# Patient Record
Sex: Male | Born: 1945 | Race: White | Hispanic: No | Marital: Married | Smoking: Current some day smoker
Health system: Southern US, Community
[De-identification: ages and names within clinical notes are randomized; demographics above are authoritative.]

## PROBLEM LIST (undated history)

## (undated) DIAGNOSIS — I2510 Atherosclerotic heart disease of native coronary artery without angina pectoris: Secondary | ICD-10-CM

## (undated) DIAGNOSIS — Z77098 Contact with and (suspected) exposure to other hazardous, chiefly nonmedicinal, chemicals: Secondary | ICD-10-CM

## (undated) DIAGNOSIS — I729 Aneurysm of unspecified site: Secondary | ICD-10-CM

## (undated) DIAGNOSIS — K439 Ventral hernia without obstruction or gangrene: Secondary | ICD-10-CM

## (undated) DIAGNOSIS — Z95810 Presence of automatic (implantable) cardiac defibrillator: Secondary | ICD-10-CM

## (undated) DIAGNOSIS — I10 Essential (primary) hypertension: Secondary | ICD-10-CM

## (undated) DIAGNOSIS — E079 Disorder of thyroid, unspecified: Secondary | ICD-10-CM

## (undated) DIAGNOSIS — E119 Type 2 diabetes mellitus without complications: Secondary | ICD-10-CM

## (undated) DIAGNOSIS — I4891 Unspecified atrial fibrillation: Secondary | ICD-10-CM

## (undated) HISTORY — DX: Essential (primary) hypertension: I10

## (undated) HISTORY — PX: OTHER SURGICAL HISTORY: SHX169

---

## 2018-05-17 ENCOUNTER — Ambulatory Visit (INDEPENDENT_AMBULATORY_CARE_PROVIDER_SITE_OTHER): Payer: Self-pay | Admitting: Otolaryngology

## 2018-06-13 ENCOUNTER — Encounter (HOSPITAL_COMMUNITY): Payer: Self-pay

## 2018-06-13 ENCOUNTER — Inpatient Hospital Stay (HOSPITAL_COMMUNITY)
Admission: EM | Admit: 2018-06-13 | Discharge: 2018-06-16 | DRG: 871 | Disposition: A | Discharge status: Home / Self Care | Payer: Non-veteran care | Attending: Internal Medicine | Admitting: Internal Medicine

## 2018-06-13 ENCOUNTER — Emergency Department (HOSPITAL_COMMUNITY): Payer: Non-veteran care

## 2018-06-13 ENCOUNTER — Other Ambulatory Visit: Payer: Self-pay

## 2018-06-13 DIAGNOSIS — I2510 Atherosclerotic heart disease of native coronary artery without angina pectoris: Secondary | ICD-10-CM | POA: Diagnosis present

## 2018-06-13 DIAGNOSIS — E876 Hypokalemia: Secondary | ICD-10-CM | POA: Diagnosis present

## 2018-06-13 DIAGNOSIS — J101 Influenza due to other identified influenza virus with other respiratory manifestations: Secondary | ICD-10-CM | POA: Diagnosis present

## 2018-06-13 DIAGNOSIS — R531 Weakness: Secondary | ICD-10-CM

## 2018-06-13 DIAGNOSIS — A419 Sepsis, unspecified organism: Secondary | ICD-10-CM | POA: Diagnosis present

## 2018-06-13 DIAGNOSIS — Z8679 Personal history of other diseases of the circulatory system: Secondary | ICD-10-CM

## 2018-06-13 DIAGNOSIS — Z6820 Body mass index (BMI) 20.0-20.9, adult: Secondary | ICD-10-CM

## 2018-06-13 DIAGNOSIS — E039 Hypothyroidism, unspecified: Secondary | ICD-10-CM | POA: Diagnosis present

## 2018-06-13 DIAGNOSIS — R627 Adult failure to thrive: Secondary | ICD-10-CM | POA: Diagnosis present

## 2018-06-13 DIAGNOSIS — J9601 Acute respiratory failure with hypoxia: Secondary | ICD-10-CM | POA: Diagnosis present

## 2018-06-13 DIAGNOSIS — I5022 Chronic systolic (congestive) heart failure: Secondary | ICD-10-CM | POA: Diagnosis not present

## 2018-06-13 DIAGNOSIS — E861 Hypovolemia: Secondary | ICD-10-CM | POA: Diagnosis present

## 2018-06-13 DIAGNOSIS — Z77098 Contact with and (suspected) exposure to other hazardous, chiefly nonmedicinal, chemicals: Secondary | ICD-10-CM

## 2018-06-13 DIAGNOSIS — I482 Chronic atrial fibrillation, unspecified: Secondary | ICD-10-CM | POA: Diagnosis present

## 2018-06-13 DIAGNOSIS — Z79890 Hormone replacement therapy (postmenopausal): Secondary | ICD-10-CM

## 2018-06-13 DIAGNOSIS — Z95810 Presence of automatic (implantable) cardiac defibrillator: Secondary | ICD-10-CM | POA: Diagnosis present

## 2018-06-13 DIAGNOSIS — Z7901 Long term (current) use of anticoagulants: Secondary | ICD-10-CM | POA: Diagnosis not present

## 2018-06-13 DIAGNOSIS — Z87891 Personal history of nicotine dependence: Secondary | ICD-10-CM

## 2018-06-13 DIAGNOSIS — I252 Old myocardial infarction: Secondary | ICD-10-CM | POA: Diagnosis not present

## 2018-06-13 DIAGNOSIS — Z79899 Other long term (current) drug therapy: Secondary | ICD-10-CM

## 2018-06-13 DIAGNOSIS — Z955 Presence of coronary angioplasty implant and graft: Secondary | ICD-10-CM

## 2018-06-13 DIAGNOSIS — E46 Unspecified protein-calorie malnutrition: Secondary | ICD-10-CM | POA: Diagnosis present

## 2018-06-13 DIAGNOSIS — I5033 Acute on chronic diastolic (congestive) heart failure: Secondary | ICD-10-CM | POA: Diagnosis not present

## 2018-06-13 DIAGNOSIS — Z7902 Long term (current) use of antithrombotics/antiplatelets: Secondary | ICD-10-CM | POA: Diagnosis not present

## 2018-06-13 DIAGNOSIS — I5032 Chronic diastolic (congestive) heart failure: Secondary | ICD-10-CM | POA: Diagnosis present

## 2018-06-13 DIAGNOSIS — E440 Moderate protein-calorie malnutrition: Secondary | ICD-10-CM

## 2018-06-13 DIAGNOSIS — R0602 Shortness of breath: Secondary | ICD-10-CM | POA: Diagnosis not present

## 2018-06-13 HISTORY — DX: Aneurysm of unspecified site: I72.9

## 2018-06-13 HISTORY — DX: Disorder of thyroid, unspecified: E07.9

## 2018-06-13 HISTORY — DX: Type 2 diabetes mellitus without complications: E11.9

## 2018-06-13 HISTORY — DX: Ventral hernia without obstruction or gangrene: K43.9

## 2018-06-13 HISTORY — DX: Contact with and (suspected) exposure to other hazardous, chiefly nonmedicinal, chemicals: Z77.098

## 2018-06-13 LAB — CBC WITH DIFFERENTIAL/PLATELET
Abs Immature Granulocytes: 0.04 10*3/uL (ref 0.00–0.07)
Basophils Absolute: 0 10*3/uL (ref 0.0–0.1)
Basophils Relative: 0 %
Eosinophils Absolute: 0.2 10*3/uL (ref 0.0–0.5)
Eosinophils Relative: 2 %
HCT: 34.5 % — ABNORMAL LOW (ref 39.0–52.0)
Hemoglobin: 12.1 g/dL — ABNORMAL LOW (ref 13.0–17.0)
Immature Granulocytes: 1 %
Lymphocytes Relative: 3 %
Lymphs Abs: 0.2 10*3/uL — ABNORMAL LOW (ref 0.7–4.0)
MCH: 35.1 pg — ABNORMAL HIGH (ref 26.0–34.0)
MCHC: 35.1 g/dL (ref 30.0–36.0)
MCV: 100 fL (ref 80.0–100.0)
Monocytes Absolute: 0.6 10*3/uL (ref 0.1–1.0)
Monocytes Relative: 7 %
Neutro Abs: 7.3 10*3/uL (ref 1.7–7.7)
Neutrophils Relative %: 87 %
Platelets: 189 10*3/uL (ref 150–400)
RBC: 3.45 MIL/uL — ABNORMAL LOW (ref 4.22–5.81)
RDW: 13.1 % (ref 11.5–15.5)
WBC: 8.4 10*3/uL (ref 4.0–10.5)
nRBC: 0 % (ref 0.0–0.2)

## 2018-06-13 LAB — APTT: APTT: 43 s — AB (ref 24–36)

## 2018-06-13 LAB — URINALYSIS, ROUTINE W REFLEX MICROSCOPIC
Bacteria, UA: NONE SEEN
Bilirubin Urine: NEGATIVE
Glucose, UA: NEGATIVE mg/dL
Ketones, ur: 20 mg/dL — AB
Leukocytes,Ua: NEGATIVE
Nitrite: NEGATIVE
Protein, ur: 30 mg/dL — AB
Specific Gravity, Urine: 1.026 (ref 1.005–1.030)
pH: 5 (ref 5.0–8.0)

## 2018-06-13 LAB — PROTIME-INR
INR: 1.4 — ABNORMAL HIGH (ref 0.8–1.2)
Prothrombin Time: 17.2 seconds — ABNORMAL HIGH (ref 11.4–15.2)

## 2018-06-13 LAB — BASIC METABOLIC PANEL
Anion gap: 9 (ref 5–15)
BUN: 15 mg/dL (ref 8–23)
CO2: 23 mmol/L (ref 22–32)
Calcium: 8.7 mg/dL — ABNORMAL LOW (ref 8.9–10.3)
Chloride: 107 mmol/L (ref 98–111)
Creatinine, Ser: 1.21 mg/dL (ref 0.61–1.24)
GFR calc Af Amer: >60 mL/min (ref >60)
GFR calc non Af Amer: 58 mL/min — ABNORMAL LOW (ref >60)
Glucose, Bld: 116 mg/dL — ABNORMAL HIGH (ref 70–99)
Potassium: 3.2 mmol/L — ABNORMAL LOW (ref 3.5–5.1)
Sodium: 139 mmol/L (ref 135–145)

## 2018-06-13 LAB — PROCALCITONIN: Procalcitonin: 0.16 ng/mL

## 2018-06-13 LAB — MRSA PCR SCREENING: MRSA by PCR: NEGATIVE

## 2018-06-13 LAB — LACTIC ACID, PLASMA
Lactic Acid, Venous: 1.8 mmol/L (ref 0.5–1.9)
Lactic Acid, Venous: 1.6 mmol/L (ref 0.5–1.9)
Lactic Acid, Venous: 4.8 mmol/L (ref 0.5–1.9)

## 2018-06-13 LAB — INFLUENZA PANEL BY PCR (TYPE A & B)
Influenza A By PCR: POSITIVE — AB
Influenza B By PCR: NEGATIVE

## 2018-06-13 MED ORDER — IBUPROFEN 400 MG PO TABS
600.0000 mg | ORAL_TABLET | Freq: Once | ORAL | Status: AC
  Administered 2018-06-13: 600 mg via ORAL
  Filled 2018-06-13: qty 2

## 2018-06-13 MED ORDER — ALBUTEROL SULFATE HFA 108 (90 BASE) MCG/ACT IN AERS
2.0000 | INHALATION_SPRAY | Freq: Once | RESPIRATORY_TRACT | Status: AC
  Administered 2018-06-13: 2 via RESPIRATORY_TRACT
  Filled 2018-06-13: qty 6.7

## 2018-06-13 MED ORDER — SODIUM CHLORIDE 0.9 % IV BOLUS
500.0000 mL | Freq: Once | INTRAVENOUS | Status: AC
  Administered 2018-06-13: 500 mL via INTRAVENOUS

## 2018-06-13 MED ORDER — ALBUTEROL SULFATE (2.5 MG/3ML) 0.083% IN NEBU: 2.5000 mg | INHALATION_SOLUTION | RESPIRATORY_TRACT | Status: DC | PRN

## 2018-06-13 MED ORDER — ONDANSETRON HCL 4 MG/2ML IJ SOLN
4.0000 mg | Freq: Four times a day (QID) | INTRAMUSCULAR | Status: DC | PRN
  Administered 2018-06-13 – 2018-06-14 (×2): 4 mg via INTRAVENOUS
  Filled 2018-06-13 (×2): qty 2

## 2018-06-13 MED ORDER — SODIUM CHLORIDE 0.9 % IV SOLN
INTRAVENOUS | Status: AC
  Administered 2018-06-13 – 2018-06-14 (×3): via INTRAVENOUS

## 2018-06-13 MED ORDER — IPRATROPIUM-ALBUTEROL 0.5-2.5 (3) MG/3ML IN SOLN
3.0000 mL | Freq: Once | RESPIRATORY_TRACT | Status: AC
  Administered 2018-06-13: 3 mL via RESPIRATORY_TRACT
  Filled 2018-06-13: qty 3

## 2018-06-13 MED ORDER — OXYCODONE-ACETAMINOPHEN 5-325 MG PO TABS
1.0000 | ORAL_TABLET | Freq: Once | ORAL | Status: AC
  Administered 2018-06-13: 1 via ORAL
  Filled 2018-06-13: qty 1

## 2018-06-13 MED ORDER — ATORVASTATIN CALCIUM 20 MG PO TABS
20.0000 mg | ORAL_TABLET | Freq: Every day | ORAL | Status: DC
  Administered 2018-06-13 – 2018-06-15 (×3): 20 mg via ORAL
  Filled 2018-06-13 (×3): qty 1

## 2018-06-13 MED ORDER — IBUPROFEN 400 MG PO TABS
600.0000 mg | ORAL_TABLET | Freq: Four times a day (QID) | ORAL | Status: DC | PRN
  Administered 2018-06-13: 600 mg via ORAL
  Filled 2018-06-13: qty 2

## 2018-06-13 MED ORDER — SODIUM CHLORIDE 0.9 % IV BOLUS
1000.0000 mL | Freq: Once | INTRAVENOUS | Status: AC
  Administered 2018-06-13: 1000 mL via INTRAVENOUS

## 2018-06-13 MED ORDER — LEVOTHYROXINE SODIUM 112 MCG PO TABS
112.0000 ug | ORAL_TABLET | Freq: Every morning | ORAL | Status: DC
  Administered 2018-06-14 – 2018-06-16 (×3): 112 ug via ORAL
  Filled 2018-06-13 (×3): qty 1

## 2018-06-13 MED ORDER — ACETAMINOPHEN 650 MG RE SUPP: 650.0000 mg | Freq: Four times a day (QID) | RECTAL | Status: DC | PRN

## 2018-06-13 MED ORDER — TRAZODONE HCL 50 MG PO TABS
200.0000 mg | ORAL_TABLET | Freq: Every day | ORAL | Status: DC
  Administered 2018-06-13 – 2018-06-15 (×3): 200 mg via ORAL
  Filled 2018-06-13 (×3): qty 4

## 2018-06-13 MED ORDER — GUAIFENESIN-DM 100-10 MG/5ML PO SYRP: 5.0000 mL | ORAL_SOLUTION | ORAL | Status: DC | PRN

## 2018-06-13 MED ORDER — ADULT MULTIVITAMIN W/MINERALS CH
1.0000 | ORAL_TABLET | Freq: Every day | ORAL | Status: DC
  Administered 2018-06-13 – 2018-06-16 (×4): 1 via ORAL
  Filled 2018-06-13 (×4): qty 1

## 2018-06-13 MED ORDER — ACETAMINOPHEN 325 MG PO TABS
650.0000 mg | ORAL_TABLET | Freq: Once | ORAL | Status: AC
  Administered 2018-06-13: 650 mg via ORAL

## 2018-06-13 MED ORDER — POTASSIUM CHLORIDE CRYS ER 20 MEQ PO TBCR
40.0000 meq | EXTENDED_RELEASE_TABLET | Freq: Once | ORAL | Status: AC
  Administered 2018-06-13: 40 meq via ORAL
  Filled 2018-06-13: qty 2

## 2018-06-13 MED ORDER — ONDANSETRON HCL 4 MG PO TABS: 4.0000 mg | ORAL_TABLET | Freq: Four times a day (QID) | ORAL | Status: DC | PRN

## 2018-06-13 MED ORDER — OSELTAMIVIR PHOSPHATE 75 MG PO CAPS: 75.0000 mg | ORAL_CAPSULE | Freq: Two times a day (BID) | ORAL | 0 refills | Status: DC

## 2018-06-13 MED ORDER — DM-GUAIFENESIN ER 30-600 MG PO TB12
1.0000 | ORAL_TABLET | Freq: Two times a day (BID) | ORAL | Status: DC
  Administered 2018-06-13 – 2018-06-16 (×6): 1 via ORAL
  Filled 2018-06-13 (×6): qty 1

## 2018-06-13 MED ORDER — LORATADINE 10 MG PO TABS
10.0000 mg | ORAL_TABLET | Freq: Every day | ORAL | Status: DC
  Administered 2018-06-14 – 2018-06-16 (×3): 10 mg via ORAL
  Filled 2018-06-13 (×3): qty 1

## 2018-06-13 MED ORDER — ACETAMINOPHEN 325 MG PO TABS
650.0000 mg | ORAL_TABLET | Freq: Four times a day (QID) | ORAL | Status: DC | PRN
  Administered 2018-06-16: 650 mg via ORAL
  Filled 2018-06-13: qty 2

## 2018-06-13 MED ORDER — OSELTAMIVIR PHOSPHATE 75 MG PO CAPS
75.0000 mg | ORAL_CAPSULE | Freq: Once | ORAL | Status: AC
  Administered 2018-06-13: 75 mg via ORAL
  Filled 2018-06-13: qty 1

## 2018-06-13 MED ORDER — AMIODARONE HCL 200 MG PO TABS
100.0000 mg | ORAL_TABLET | Freq: Every day | ORAL | Status: DC
  Administered 2018-06-13 – 2018-06-16 (×4): 100 mg via ORAL
  Filled 2018-06-13 (×4): qty 1

## 2018-06-13 MED ORDER — CLOPIDOGREL BISULFATE 75 MG PO TABS
75.0000 mg | ORAL_TABLET | Freq: Every morning | ORAL | Status: DC
  Administered 2018-06-14 – 2018-06-16 (×3): 75 mg via ORAL
  Filled 2018-06-13 (×3): qty 1

## 2018-06-13 MED ORDER — OSELTAMIVIR PHOSPHATE 75 MG PO CAPS
75.0000 mg | ORAL_CAPSULE | Freq: Two times a day (BID) | ORAL | Status: DC
  Administered 2018-06-14: 75 mg via ORAL
  Filled 2018-06-13: qty 1

## 2018-06-13 MED ORDER — APIXABAN 5 MG PO TABS
5.0000 mg | ORAL_TABLET | Freq: Two times a day (BID) | ORAL | Status: DC
  Administered 2018-06-13 – 2018-06-16 (×6): 5 mg via ORAL
  Filled 2018-06-13 (×6): qty 1

## 2018-06-13 MED ORDER — PROMETHAZINE HCL 12.5 MG PO TABS
25.0000 mg | ORAL_TABLET | Freq: Four times a day (QID) | ORAL | Status: DC | PRN
  Administered 2018-06-14: 25 mg via ORAL
  Filled 2018-06-13: qty 2

## 2018-06-13 NOTE — ED Notes (Signed)
ED TO INPATIENT HANDOFF REPORT  ED Nurse Name and Phone #:  Dorathy Daft [phone]  S Name/Age/Gender Anthony Charles 73 y.o. male Room/Bed: APA18/APA18  Code Status   Code Status: Not on file  Home/SNF/Other Home Patient oriented to: self, place, time and situation Is this baseline? Yes   Triage Complete: Triage complete  Chief Complaint weakness fever  Triage Note Ems reports pt lives with wife at home.  C/O generalized weakness, bodyaches, and cough x 2 days.   Pt reports he went to [place] in [place] last week for regular appt.  Reports his pacemaker/defibrillator has fired frequently.     Allergies No Known Allergies  Level of Care/Admitting Diagnosis ED Disposition    ED Disposition Condition Comment   Admit  Hospital Area: [facility] [[id number]]  Level of Care: Stepdown [14]  Diagnosis: Sepsis [facility]) [[id number]]  Admitting Physician: Eddie North [phone]  Attending Physician: Eddie North [phone]  Estimated length of stay: past midnight tomorrow  Certification:: I certify this patient will need inpatient services for at least 2 midnights  PT Class (Do Not Modify): Inpatient [101]  PT Acc Code (Do Not Modify): Private [1]       B Medical/Surgery History Past Medical History:  Diagnosis Date  . Agent orange exposure   . Aneurysm (HCC)   . Diabetes mellitus without complication (HCC)   . Hernia of abdominal wall   . Thyroid disease    Past Surgical History:  Procedure Laterality Date  . pacemaker/defibrillator       A IV Location/Drains/Wounds Patient Lines/Drains/Airways Status   Active Line/Drains/Airways    Name:   Placement date:   Placement time:   Site:   Days:   Peripheral IV 06/13/18 Right Antecubital   06/13/18    1326    Antecubital   less than 1          Intake/Output Last 24 hours  Intake/Output Summary (Last 24 hours) at 06/13/2018 2053 Last data filed at 06/13/2018 1854 Gross per 24 hour  Intake 1000 ml  Output -  Net 1000 ml     Labs/Imaging Results for orders placed or performed during the hospital encounter of 06/13/18 (from the past 48 hour(s))  Urinalysis, Routine w reflex microscopic     Status: Abnormal   Collection Time: 06/13/18  1:09 PM  Result Value Ref Range   Color, Urine YELLOW YELLOW   APPearance HAZY (A) CLEAR   Specific Gravity, Urine 1.026 1.005 - 1.030   pH 5.0 5.0 - 8.0   Glucose, UA NEGATIVE NEGATIVE mg/dL   Hgb urine dipstick SMALL (A) NEGATIVE   Bilirubin Urine NEGATIVE NEGATIVE   Ketones, ur 20 (A) NEGATIVE mg/dL   Protein, ur 30 (A) NEGATIVE mg/dL   Nitrite NEGATIVE NEGATIVE   Leukocytes,Ua NEGATIVE NEGATIVE   RBC / HPF 6-10 0 - 5 RBC/hpf   WBC, UA 0-5 0 - 5 WBC/hpf   Bacteria, UA NONE SEEN NONE SEEN   Squamous Epithelial / LPF 0-5 0 - 5   Mucus PRESENT     Comment: Performed at [facility], [address] [place], [place] [address]  Influenza panel by PCR (type A & B)     Status: Abnormal   Collection Time: 06/13/18  1:09 PM  Result Value Ref Range   Influenza A By PCR POSITIVE (A) NEGATIVE   Influenza B By PCR NEGATIVE NEGATIVE    Comment: (NOTE) The Xpert Xpress Flu assay is intended as an aid in the diagnosis of  influenza and should not be used as a sole basis for treatment.  This  assay is FDA approved for nasopharyngeal swab specimens only. Nasal  washings and aspirates are unacceptable for Xpert Xpress Flu testing. Performed at [facility], [address], [place], [place] [address]   Blood culture (routine x 2)     Status: None (Preliminary result)   Collection Time: 06/13/18  1:20 PM  Result Value Ref Range   Specimen Description RIGHT ANTECUBITAL    Special Requests      BOTTLES DRAWN AEROBIC AND ANAEROBIC Blood Culture adequate volume Performed at [facility], [address] [place], [place] [address]    Culture PENDING    Report Status PENDING   CBC with Differential     Status: Abnormal   Collection Time: 06/13/18  1:35 PM  Result Value Ref Range    WBC 8.4 4.0 - 10.5 K/uL   RBC 3.45 (L) 4.22 - 5.81 MIL/uL   Hemoglobin 12.1 (L) 13.0 - 17.0 g/dL   HCT 08.8 (L) 11.0 - 31.5 %   MCV 100.0 80.0 - 100.0 fL   MCH 35.1 (H) 26.0 - 34.0 pg   MCHC 35.1 30.0 - 36.0 g/dL   RDW 94.5 85.9 - 29.2 %   Platelets 189 150 - 400 K/uL   nRBC 0.0 0.0 - 0.2 %   Neutrophils Relative % 87 %   Neutro Abs 7.3 1.7 - 7.7 K/uL   Lymphocytes Relative 3 %   Lymphs Abs 0.2 (L) 0.7 - 4.0 K/uL   Monocytes Relative 7 %   Monocytes Absolute 0.6 0.1 - 1.0 K/uL   Eosinophils Relative 2 %   Eosinophils Absolute 0.2 0.0 - 0.5 K/uL   Basophils Relative 0 %   Basophils Absolute 0.0 0.0 - 0.1 K/uL   Immature Granulocytes 1 %   Abs Immature Granulocytes 0.04 0.00 - 0.07 K/uL    Comment: Performed at [facility], [address], [place], [place] [address]  Basic metabolic panel     Status: Abnormal   Collection Time: 06/13/18  1:35 PM  Result Value Ref Range   Sodium 139 135 - 145 mmol/L   Potassium 3.2 (L) 3.5 - 5.1 mmol/L   Chloride 107 98 - 111 mmol/L   CO2 23 22 - 32 mmol/L   Glucose, Bld 116 (H) 70 - 99 mg/dL   BUN 15 8 - 23 mg/dL   Creatinine, Ser 6.38 0.61 - 1.24 mg/dL   Calcium 8.7 (L) 8.9 - 10.3 mg/dL   GFR calc non Af Amer 58 (L) >60 mL/min   GFR calc Af Amer >60 >60 mL/min   Anion gap 9 5 - 15    Comment: Performed at [facility], [address] [place], [place] [address]  Lactic acid, plasma     Status: None   Collection Time: 06/13/18  1:35 PM  Result Value Ref Range   Lactic Acid, Venous 1.8 0.5 - 1.9 mmol/L    Comment: Performed at [facility], [address], [place], [place] [address]  Blood culture (routine x 2)     Status: None (Preliminary result)   Collection Time: 06/13/18  1:45 PM  Result Value Ref Range   Specimen Description LEFT ANTECUBITAL    Special Requests      BOTTLES DRAWN AEROBIC AND ANAEROBIC Blood Culture adequate volume Performed at [facility], [address], [place], [place] [address]    Culture PENDING    Report  Status PENDING   Lactic  acid, plasma     Status: None   Collection Time: 06/13/18  3:27 PM  Result Value Ref Range   Lactic Acid, Venous 1.6 0.5 - 1.9 mmol/L    Comment: Performed at [facility], [address], [place], [place] [address]   Dg Chest 2 View  Result Date: 06/13/2018 CLINICAL DATA:  Weakness, body aches, cough EXAM: CHEST - 2 VIEW COMPARISON:  None. FINDINGS: Pulmonary hyperinflation and probable emphysematous change. No acute abnormality of the lungs. Left chest multi lead pacer defibrillator. Cardiomegaly. Disc degenerative disease of the thoracic spine. IMPRESSION: Pulmonary hyperinflation and probable emphysematous change. No acute appearing airspace opacity. Cardiomegaly. Electronically Signed   By: Lauralyn Primes M.D.   On: 06/13/2018 14:45    Pending Labs [employer] (From admission, onward)    Start     Ordered   Signed and Held  Lactic acid, plasma  STAT Now then every 3 hours,   STAT     Signed and Held   Signed and Held  Procalcitonin  ONCE - STAT,   R     Signed and Held   Signed and Held  Protime-INR  ONCE - STAT,   R     Signed and Held   Signed and Held  APTT  ONCE - STAT,   R     Signed and Held   Signed and Armed forces training and education officer morning,   R     Signed and Held   Signed and Held  CBC  Tomorrow morning,   R     Signed and Held          Vitals/Pain Today's Vitals   06/13/18 2000 06/13/18 2015 06/13/18 2030 06/13/18 2045  BP: (!) 83/69  100/64   Pulse: (!) 128 (!) 139  80  Resp: (!) 22 (!) 25  14  Temp:      TempSrc:      SpO2: 94% 95%    Weight:      Height:      PainSc:        Isolation Precautions Droplet precaution  Medications Medications  0.9 %  sodium chloride infusion ( Intravenous New Bag/Given 06/13/18 2003)  ibuprofen (ADVIL,MOTRIN) tablet 600 mg (600 mg Oral Given 06/13/18 1851)  acetaminophen (TYLENOL) tablet 650 mg (650 mg Oral Given 06/13/18 1424)  oseltamivir (TAMIFLU) capsule 75 mg (75 mg Oral Given  06/13/18 1459)  potassium chloride SA (K-DUR,KLOR-CON) CR tablet 40 mEq (40 mEq Oral Given 06/13/18 1459)  sodium chloride 0.9 % bolus 1,000 mL (0 mLs Intravenous Stopped 06/13/18 1854)  ipratropium-albuterol (DUONEB) 0.5-2.5 (3) MG/3ML nebulizer solution 3 mL (3 mLs Nebulization Given 06/13/18 1535)  ibuprofen (ADVIL,MOTRIN) tablet 600 mg (600 mg Oral Given 06/13/18 1526)  oxyCODONE-acetaminophen (PERCOCET/ROXICET) 5-325 MG per tablet 1 tablet (1 tablet Oral Given 06/13/18 1526)  albuterol (PROVENTIL HFA;VENTOLIN HFA) 108 (90 Base) MCG/ACT inhaler 2 puff (2 puffs Inhalation Given 06/13/18 1651)    Mobility walks Moderate fall risk   Focused Assessments Pulmonary Assessment Handoff:  Lung sounds: Bilateral Breath Sounds: Diminished O2 Device: Room Air        R Recommendations: See Admitting Provider Note  Report given to:   Additional Notes:

## 2018-06-13 NOTE — ED Triage Notes (Signed)
Ems reports pt lives with wife at home.  C/O generalized weakness, bodyaches, and cough x 2 days.

## 2018-06-13 NOTE — ED Notes (Signed)
hospitalist in with pt at this time 

## 2018-06-13 NOTE — ED Triage Notes (Signed)
Pt reports he went to [place] in [place] last week for regular appt.  Reports his pacemaker/defibrillator has fired frequently.

## 2018-06-13 NOTE — Progress Notes (Signed)
CRITICAL VALUE ALERT  Critical Value:  Lactic acid 4.8  Date & Time Notied:  06-13-2018 2245  Provider Notified: Schorr  Orders Received/Actions taken: pending new orders

## 2018-06-13 NOTE — ED Notes (Signed)
Went in to d/c pt, rr 24, o2 sat 88-90% on room air and bp 85/53.  Notified Dr. Jeraldine Loots and he will assess pt.

## 2018-06-13 NOTE — H&P (Addendum)
TRH H&P   Patient Demographics:    Anthony Charles, is a 73 y.o. male  MRN: [id number]   DOB - 07/07/1945  Admit Date - 06/13/2018  [facility] [place] ([place] living in [place] for past [place] [place] [place]  Referring MD: Dr. Jeraldine LootsLockwood  Outpatient Specialists: Cardi[place] in [place]  Patient coming from: Home  Chief Complaint  Patient presents with   Weakness   Fever      HPI:    Anthony Conferorman Defibaugh  is a 73 y.o. male, [place] veteran with history of CAD with history of MI s/p stenting in 2016 in [place], history of defibrillator and?  Pacemaker status, A. 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  He checked his temperature this morning and was 100 F.  Reports nausea but no vomiting.  Has poor appetite.  Patient reports progressively getting worse with increased weakness and poor appetite.  He denies any sick contact or recent travel except for going [place] [facility] [place] [place] months back for his visit to the cardiologist and his defibrillator checked at that time.  He denies any new medications, sick contact.  Denies any headache, dizziness, blurred vision, chest pain, orthopnea or PND.  Denies any abdominal pain, dysuria or diarrhea.  Denies any tingling or numbness of his extremities, fall or trauma.  Denies any recent illness. He informs that he didn't take flu vaccine this season.  The ED his vitals initially were stable but he became hypotensive with systolic blood pressure in the 70s, tachycardic to 140s, tachypneic and O2 sat dropped to mid 80s requiring 2 L via nasal  cannula.  He was then found to be febrile at 102.4 F.  Blood work showed WC of 8.4, hemoglobin of 12.1, normal platelets.  Sodium of 139, K of 3.2, normal anion gap, BUN of 15 and creatinine 1.21, glucose of 116.  Lactic acid of 1.6.  UA unremarkable.  Blood culture sent from the ED.  Flu PCR was positive for influenza A.  Chest x-ray showed emphysematous changes.  EKG showed paced rhythm without any significant changes Patient given DuoNeb, Tamiflu, Percocet, potassium supplement and 1 L normal saline bolus. Blood pressure started to slowly improve. Hospitalist consulted for admission to stepdown for sepsis and acute respiratory failure with hypoxia secondary to influenza A.   Review of systems:    In addition to the HPI above, (positive symptoms in bold) Fever + + +, chills + + No Headache, No changes with Vision or hearing, No problems swallowing food or Liquids, No Chest pain,  Cough + +, shortness of Breath + + +, No Abdominal pain, nausea +, no vomiting,, Bowel movements are regular, No Blood in stool or Urine, No dysuria, No new skin rashes or bruises, No new joints pains-aches,  Generalized weakness + +, tingling, numbness in any extremity, No recent weight gain or loss, No polyuria, polydypsia or polyphagia, No significant Mental Stressors.    With Past History of the following :    Past Medical History:  Diagnosis Date   Agent orange exposure    Aneurysm (HCC)        Hernia of abdominal wall    Thyroid disease   Coronary artery disease Chronic A. fib History of congestive heart failure (unspecified)   Past Surgical History:  Procedure Laterality Date   pacemaker/defibrillator        Social History:     Social History   Tobacco Use   Smoking status: Former Smoker   Smokeless tobacco: Never Used  Substance Use Topics   Alcohol use: Never    Frequency: Never     Lives -home with wife   Mobility -independent   Family History :   No family  history of heart disease, stroke or diabetes  Home Medications:   Prior to Admission medications   Medication Sig Start Date End Date Taking? Authorizing Provider  amiodarone (PACERONE) 200 MG tablet Take 100 mg by mouth daily.   Yes [provider]  apixaban (ELIQUIS) 5 MG TABS tablet Take 5 mg by mouth 2 (two) times daily.   Yes [provider]  atorvastatin (LIPITOR) 20 MG tablet Take 20 mg by mouth at bedtime.   Yes [provider]  cetirizine (ZYRTEC) 10 MG tablet Take 5 mg by mouth daily.   Yes [provider]  clopidogrel (PLAVIX) 75 MG tablet Take 75 mg by mouth every morning.   Yes [provider]  ibuprofen (ADVIL,MOTRIN) 600 MG tablet Take 600 mg by mouth every 6 (six) hours as needed for mild pain or moderate pain.   Yes [provider]  levothyroxine (SYNTHROID, LEVOTHROID) 112 MCG tablet Take 112 mcg by mouth every morning.   Yes [provider]  metoprolol succinate (TOPROL-XL) 100 MG 24 hr tablet Take 50 mg by mouth daily. Take with or immediately following a meal.   Yes [provider]  Multiple Vitamin (MULTIVITAMIN WITH MINERALS) TABS tablet Take 1 tablet by mouth daily.   Yes [provider]  promethazine (PHENERGAN) 25 MG tablet Take 25 mg by mouth every 6 (six) hours as needed for nausea or vomiting.   Yes [provider]  traZODone (DESYREL) 100 MG tablet Take 200 mg by mouth at bedtime.   Yes [provider]  oseltamivir (TAMIFLU) 75 MG capsule Take 1 capsule (75 mg total) by mouth every 12 (twelve) hours. 06/13/18   Raeford Razor, MD     Allergies:    No Known Allergies   Physical Exam:   Vitals  Blood pressure (!) 85/53, pulse 90, temperature 99.5 F (37.5 C), temperature source Oral, resp. rate (!) 24, height 5\' 10"  (1.778 m), weight 59.9 kg, SpO2 (!) 87 %.   General: Elderly male lying in bed fatigued HEENT: Pupils reactive bilateral, EOMI, no pallor, no  icterus, temporal wasting, moist mucosa, supple neck, no cervical lymphadenopathy, no JVD Chest: Diffuse bilateral wheezing, no crackles or rhonchi, implantable defibrillator + CVS: Normal S1 and S2, no murmurs rub or gallop GI: Soft, nondistended, nontender, bowel sounds present Musculoskeletal: Warm,  no edema, normal skin CNS: Alert and oriented, nonfocal   Data Review:    CBC Recent Labs  Lab 06/13/18 1335  WBC 8.4  HGB 12.1*  HCT 34.5*  PLT 189  MCV 100.0  MCH 35.1*  MCHC 35.1  RDW 13.1  LYMPHSABS 0.2*  MONOABS 0.6  EOSABS 0.2  BASOSABS 0.0   ------------------------------------------------------------------------------------------------------------------  Chemistries  Recent Labs  Lab 06/13/18 1335  NA 139  K 3.2*  CL 107  CO2 23  GLUCOSE 116*  BUN 15  CREATININE 1.21  CALCIUM 8.7*   ------------------------------------------------------------------------------------------------------------------ estimated creatinine clearance is 44.7 mL/min (by C-G formula based on SCr of 1.21 mg/dL). ------------------------------------------------------------------------------------------------------------------ No results for input(s): TSH, T4TOTAL, T3FREE, THYROIDAB in the last 72 hours.  Invalid input(s): FREET3  Coagulation profile No results for input(s): INR, PROTIME in the last 168 hours. ------------------------------------------------------------------------------------------------------------------- No results for input(s): DDIMER in the last 72 hours. -------------------------------------------------------------------------------------------------------------------  Cardiac Enzymes No results for input(s): CKMB, TROPONINI, MYOGLOBIN in the last 168 hours.  Invalid input(s): CK ------------------------------------------------------------------------------------------------------------------ No results found for:  BNP   ---------------------------------------------------------------------------------------------------------------  Urinalysis    Component Value Date/Time   COLORURINE YELLOW 06/13/2018 1309   APPEARANCEUR HAZY (A) 06/13/2018 1309   LABSPEC 1.026 06/13/2018 1309   PHURINE 5.0 06/13/2018 1309   GLUCOSEU NEGATIVE 06/13/2018 1309   HGBUR SMALL (A) 06/13/2018 1309   BILIRUBINUR NEGATIVE 06/13/2018 1309   KETONESUR 20 (A) 06/13/2018 1309   PROTEINUR 30 (A) 06/13/2018 1309   NITRITE NEGATIVE 06/13/2018 1309   LEUKOCYTESUR NEGATIVE 06/13/2018 1309    ----------------------------------------------------------------------------------------------------------------   Imaging Results:    Dg Chest 2 View  Result Date: 06/13/2018 CLINICAL DATA:  Weakness, body aches, cough EXAM: CHEST - 2 VIEW COMPARISON:  None. FINDINGS: Pulmonary hyperinflation and probable emphysematous change. No acute abnormality of the lungs. Left chest multi lead pacer defibrillator. Cardiomegaly. Disc degenerative disease of the thoracic spine. IMPRESSION: Pulmonary hyperinflation and probable emphysematous change. No acute appearing airspace opacity. Cardiomegaly. Electronically Signed   By: Lauralyn Primes M.D.   On: 06/13/2018 14:45    My personal review of EKG: Atrial paced complex, no ST-T changes   Assessment & Plan:    Principal Problem: Sepsis (HCC) Acute respiratory failure with hypoxia (HCC) Secondary to influenza A. Did not take flu vaccine this season.  Admit to stepdown unit for close monitoring.  Patient hypoxic and hypotensive in the ED.  Ordered 1 L normal saline bolus.  Will assess again and placed on maintenance normal saline at 100 cc/h given his CHF status.  Check repeat lactic acid and procalcitonin. Currently maintaining sats in low 90s on 3 L via nasal cannula.  Received continue Tamiflu twice daily.  Blood cultures sent from the ED.  UA unremarkable. Placed on PRN nebs, Tylenol and  antitussives.  Active Problems:    CAD in native artery   Hx of myocardial infarction Follows with cardiologist in [facility].  Had MI in 2016 with 3 stent per wife and has a defibrillator.     Hypokalemia Replenished.  Recheck in a.m.  Hypothyroidism Resume Synthroid.  Wife reports this is due to agent orange exposure.    Chronic?  Diastolic CHF (congestive heart failure) (HCC) Currently hypovolemic.  Fluid bolus given in the ED.  Monitor with maintenance fluid given hypotension.  Strict I's/O and daily weight.    Atrial fibrillation, chronic In sinus rhythm.  Hold metoprolol due to hypotension.  Continue amiodarone and Eliquis.   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   DVT Prophylaxis: Eliquis  AM Labs Ordered, also please review Full Orders  Family Communication: Admission, patients condition and plan of care including tests being ordered have been discussed with the patient and his wife at bedside  Code Status full code  Likely DC to home possibly in the next 72 hours  Condition GUARDED    Consults called: None  Admission status: Patient Patient presenting with sepsis associated with influenza A and acute respiratory failure with hypoxia, failure to thrive..  Patient needs close monitoring in inpatient stepdown unit with IV hydration, oxygen therapy, Tamiflu, nebulizer treatment for which he would need to be monitored for at least >2 midnight.  Time spent in minutes : 70   Makaveli Hoard M.D on 06/13/2018 at 6:27 PM  Between 7am to 7pm - Pager - [phone]. After 7pm go to [email or web address] - password [facility]  [facility] - Office  [phone]

## 2018-06-13 NOTE — ED Provider Notes (Signed)
Patient hypotensive, hypoxic, though he is mentating appropriately. With concern for influenza infection, the patient will require admission for supplemental oxygen ongoing fluid resuscitation and monitoring.   Anthony Munch, MD 06/13/18 [phone]

## 2018-06-13 NOTE — ED Provider Notes (Signed)
[facility] EMERGENCY DEPARTMENT Provider Note   CSN: [id number] Arrival date & time: 06/13/18  1238    History   Chief Complaint Chief Complaint  Patient presents with  . Weakness  . Fever    HPI Anthony Charles is a 73 y.o. male.     HPI   72 year old male with cough and shortness of breath.  Cough is occasionally productive for whitish sputum.  Symptoms started 2 to 3 days ago.  Progressive since then.  Feels very fatigued.  Body aches.  No specific chest pain.  No unusual swelling.  Past Medical History:  Diagnosis Date  . Agent orange exposure   . Aneurysm (HCC)   . Diabetes mellitus without complication (HCC)   . Hernia of abdominal wall   . Thyroid disease     There are no active problems to display for this patient.   Past Surgical History:  Procedure Laterality Date  . pacemaker/defibrillator          Home Medications    Prior to Admission medications   Not on File    Family History No family history on file.  Social History Social History   Tobacco Use  . Smoking status: Former Games developer  . Smokeless tobacco: Never Used  Substance Use Topics  . Alcohol use: Never    Frequency: Never  . Drug use: Never     Allergies   Patient has no known allergies.   Review of Systems Review of Systems  All systems reviewed and negative, other than as noted in HPI.  Physical Exam Updated Vital Signs BP (!) 117/99   Pulse (!) 113   Temp (!) 102.4 F (39.1 C)   Resp (!) 22   Ht 5\' 10"  (1.778 m)   Wt 59.9 kg   SpO2 93%   BMI 18.94 kg/m   Physical Exam Vitals signs and nursing note reviewed.  Constitutional:      General: He is not in acute distress.    Appearance: He is well-developed.  HENT:     Head: Normocephalic and atraumatic.  Eyes:     General:        Right eye: No discharge.        Left eye: No discharge.     Conjunctiva/sclera: Conjunctivae normal.  Neck:     Musculoskeletal: Neck supple.  Cardiovascular:     Rate and  Rhythm: Regular rhythm. Tachycardia present.     Heart sounds: Normal heart sounds. No murmur. No friction rub. No gallop.   Pulmonary:     Effort: Pulmonary effort is normal. No respiratory distress.     Breath sounds: Wheezing present.  Abdominal:     General: There is no distension.     Palpations: Abdomen is soft.     Tenderness: There is no abdominal tenderness.  Musculoskeletal:        General: No tenderness.  Skin:    General: Skin is warm and dry.  Neurological:     Mental Status: He is alert.  Psychiatric:        Behavior: Behavior normal.        Thought Content: Thought content normal.      ED Treatments / Results  Labs (all labs ordered are listed, but only abnormal results are displayed) Labs Reviewed  CBC WITH DIFFERENTIAL/PLATELET - Abnormal; Notable for the following components:      Result Value   RBC 3.45 (*)    Hemoglobin 12.1 (*)    HCT 34.5 (*)  MCH 35.1 (*)    Lymphs Abs 0.2 (*)    All other components within normal limits  BASIC METABOLIC PANEL - Abnormal; Notable for the following components:   Potassium 3.2 (*)    Glucose, Bld 116 (*)    Calcium 8.7 (*)    GFR calc non Af Amer 58 (*)    All other components within normal limits  URINALYSIS, ROUTINE W REFLEX MICROSCOPIC - Abnormal; Notable for the following components:   APPearance HAZY (*)    Hgb urine dipstick SMALL (*)    Ketones, ur 20 (*)    Protein, ur 30 (*)    All other components within normal limits  INFLUENZA PANEL BY PCR (TYPE A & B) - Abnormal; Notable for the following components:   Influenza A By PCR POSITIVE (*)    All other components within normal limits  LACTIC ACID, PLASMA - Abnormal; Notable for the following components:   Lactic Acid, Venous 4.8 (*)    All other components within normal limits  PROTIME-INR - Abnormal; Notable for the following components:   Prothrombin Time 17.2 (*)    INR 1.4 (*)    All other components within normal limits  APTT - Abnormal;  Notable for the following components:   aPTT 43 (*)    All other components within normal limits  BASIC METABOLIC PANEL - Abnormal; Notable for the following components:   Chloride 117 (*)    CO2 17 (*)    Glucose, Bld 136 (*)    Calcium 7.1 (*)    GFR calc non Af Amer 58 (*)    All other components within normal limits  CBC - Abnormal; Notable for the following components:   RBC 3.03 (*)    Hemoglobin 9.8 (*)    HCT 32.0 (*)    MCV 105.6 (*)    Platelets 122 (*)    All other components within normal limits  CBC - Abnormal; Notable for the following components:   RBC 2.94 (*)    Hemoglobin 9.5 (*)    HCT 29.9 (*)    MCV 101.7 (*)    Platelets 134 (*)    All other components within normal limits  COMPREHENSIVE METABOLIC PANEL - Abnormal; Notable for the following components:   Chloride 119 (*)    CO2 19 (*)    Glucose, Bld 128 (*)    BUN 25 (*)    Calcium 7.5 (*)    Total Protein 5.2 (*)    Albumin 2.7 (*)    AST 57 (*)    All other components within normal limits  CULTURE, BLOOD (ROUTINE X 2)  CULTURE, BLOOD (ROUTINE X 2)  MRSA PCR SCREENING  LACTIC ACID, PLASMA  LACTIC ACID, PLASMA  LACTIC ACID, PLASMA  PROCALCITONIN  LACTIC ACID, PLASMA  LACTIC ACID, PLASMA    EKG EKG Interpretation  Date/Time:  Wednesday June 13 2018 12:48:36 EDT Ventricular Rate:  84 PR Interval:    QRS Duration: 99 QT Interval:  364 QTC Calculation: 431 R Axis:   74 Text Interpretation:  Atrial-paced complexes Borderline repolarization abnormality Baseline wander in lead(s) I III aVL Confirmed by Raeford Razor [phone]) on 06/13/2018 2:34:51 PM   Radiology No results found.   Dg Chest 2 View  Result Date: 06/13/2018 CLINICAL DATA:  Weakness, body aches, cough EXAM: CHEST - 2 VIEW COMPARISON:  None. FINDINGS: Pulmonary hyperinflation and probable emphysematous change. No acute abnormality of the lungs. Left chest multi lead pacer defibrillator. Cardiomegaly. Disc  degenerative disease  of the thoracic spine. IMPRESSION: Pulmonary hyperinflation and probable emphysematous change. No acute appearing airspace opacity. Cardiomegaly. Electronically Signed   By: Lauralyn Primes M.D.   On: 06/13/2018 14:45   Dg Pelvis Portable  Result Date: 06/21/2018 CLINICAL DATA:  Hip abrasion, no known injury EXAM: PORTABLE PELVIS 1-2 VIEWS COMPARISON:  None. FINDINGS: There is no evidence of pelvic fracture or diastasis. No pelvic bone lesions are seen. IMPRESSION: Negative. Electronically Signed   By: Marlan Palau M.D.   On: 06/21/2018 14:43   Dg Chest Port 1 View  Result Date: 06/19/2018 CLINICAL DATA:  Cough, shortness of breath and chest pain. EXAM: PORTABLE CHEST 1 VIEW COMPARISON:  06/13/2018 FINDINGS: Stable heart size and appearance of dual-chamber pacemaker. Since the prior study, there has been development of mild pulmonary edema. No pleural effusions or focal airspace consolidation. No pneumothorax. Stable chronic lung disease. IMPRESSION: Development of mild pulmonary edema. Electronically Signed   By: Irish Lack M.D.   On: 06/19/2018 20:30    Procedures Procedures (including critical care time)  Medications Ordered in ED Medications - No data to display   Initial Impression / Assessment and Plan / ED Course  I have reviewed the triage vital signs and the nursing notes.  Pertinent labs & imaging results that were available during my care of the patient were reviewed by me and considered in my medical decision making (see chart for details).       73 year old male with dyspnea.  Associated with fever and chills and generalized weakness.  Positive for influenza A.  Is given breathing treatments and steroids for his wheezing.  Do not feel that he is safe enough for outpatient treatment at this time.  Will admit for ongoing treatment/stabilization. Final Clinical Impressions(s) / ED Diagnoses   Final diagnoses:  Influenza A    ED Discharge Orders    None       Raeford Razor, MD 06/23/18 1759

## 2018-06-14 LAB — CBC
HEMATOCRIT: 32 % — AB (ref 39.0–52.0)
Hemoglobin: 9.8 g/dL — ABNORMAL LOW (ref 13.0–17.0)
MCH: 32.3 pg (ref 26.0–34.0)
MCHC: 30.6 g/dL (ref 30.0–36.0)
MCV: 105.6 fL — ABNORMAL HIGH (ref 80.0–100.0)
Platelets: 122 10*3/uL — ABNORMAL LOW (ref 150–400)
RBC: 3.03 MIL/uL — ABNORMAL LOW (ref 4.22–5.81)
RDW: 13.4 % (ref 11.5–15.5)
WBC: 6.3 10*3/uL (ref 4.0–10.5)
nRBC: 0 % (ref 0.0–0.2)

## 2018-06-14 LAB — BASIC METABOLIC PANEL
Anion gap: 8 (ref 5–15)
BUN: 20 mg/dL (ref 8–23)
CO2: 17 mmol/L — ABNORMAL LOW (ref 22–32)
Calcium: 7.1 mg/dL — ABNORMAL LOW (ref 8.9–10.3)
Chloride: 117 mmol/L — ABNORMAL HIGH (ref 98–111)
Creatinine, Ser: 1.21 mg/dL (ref 0.61–1.24)
GFR calc Af Amer: >60 mL/min (ref >60)
GFR calc non Af Amer: 58 mL/min — ABNORMAL LOW (ref >60)
Glucose, Bld: 136 mg/dL — ABNORMAL HIGH (ref 70–99)
Potassium: 3.7 mmol/L (ref 3.5–5.1)
SODIUM: 142 mmol/L (ref 135–145)

## 2018-06-14 LAB — LACTIC ACID, PLASMA
Lactic Acid, Venous: 1.8 mmol/L (ref 0.5–1.9)
Lactic Acid, Venous: 1.1 mmol/L (ref 0.5–1.9)

## 2018-06-14 MED ORDER — IPRATROPIUM BROMIDE 0.02 % IN SOLN
0.5000 mg | Freq: Four times a day (QID) | RESPIRATORY_TRACT | Status: DC
  Administered 2018-06-14 – 2018-06-16 (×8): 0.5 mg via RESPIRATORY_TRACT
  Filled 2018-06-14 (×9): qty 2.5

## 2018-06-14 MED ORDER — LEVALBUTEROL HCL 0.63 MG/3ML IN NEBU
0.6300 mg | INHALATION_SOLUTION | Freq: Four times a day (QID) | RESPIRATORY_TRACT | Status: DC
  Administered 2018-06-14 – 2018-06-16 (×8): 0.63 mg via RESPIRATORY_TRACT
  Filled 2018-06-14 (×9): qty 3

## 2018-06-14 MED ORDER — ENSURE ENLIVE PO LIQD
237.0000 mL | Freq: Two times a day (BID) | ORAL | Status: DC
  Administered 2018-06-15: 237 mL via ORAL

## 2018-06-14 MED ORDER — OSELTAMIVIR PHOSPHATE 30 MG PO CAPS
30.0000 mg | ORAL_CAPSULE | Freq: Two times a day (BID) | ORAL | Status: DC
  Administered 2018-06-14 – 2018-06-16 (×4): 30 mg via ORAL
  Filled 2018-06-14 (×4): qty 1

## 2018-06-14 MED ORDER — SODIUM CHLORIDE 0.9 % IV BOLUS
1000.0000 mL | Freq: Once | INTRAVENOUS | Status: AC
  Administered 2018-06-14: 1000 mL via INTRAVENOUS

## 2018-06-14 MED ORDER — METOPROLOL SUCCINATE ER 50 MG PO TB24
50.0000 mg | ORAL_TABLET | Freq: Every day | ORAL | Status: DC
  Administered 2018-06-14 – 2018-06-16 (×3): 50 mg via ORAL
  Filled 2018-06-14 (×3): qty 1

## 2018-06-14 MED ORDER — BUDESONIDE 0.25 MG/2ML IN SUSP
0.2500 mg | Freq: Two times a day (BID) | RESPIRATORY_TRACT | Status: DC
  Administered 2018-06-14 – 2018-06-16 (×5): 0.25 mg via RESPIRATORY_TRACT
  Filled 2018-06-14 (×5): qty 2

## 2018-06-14 MED ORDER — SODIUM CHLORIDE 0.9 % IV BOLUS
500.0000 mL | Freq: Once | INTRAVENOUS | Status: AC
  Administered 2018-06-14: 500 mL via INTRAVENOUS

## 2018-06-14 MED ORDER — METHYLPREDNISOLONE SODIUM SUCC 40 MG IJ SOLR
40.0000 mg | Freq: Two times a day (BID) | INTRAMUSCULAR | Status: DC
  Administered 2018-06-14 – 2018-06-15 (×4): 40 mg via INTRAVENOUS
  Filled 2018-06-14 (×4): qty 1

## 2018-06-14 NOTE — Evaluation (Signed)
Physical Therapy Evaluation Patient Details Name: Anthony Charles MRN: [id number] DOB: [birth year] Today's Date: 06/14/2018   History of Present Illness  Anthony Charles  is a 73 y.o. male, [name] with history of CAD with history of MI s/p stenting in 2016 in [place], history of defibrillator and?  Pacemaker status, A. 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  He checked his temperature this morning and was 100 F.  Reports nausea but no vomiting.  Has poor appetite.  Patient reports progressively getting worse with increased weakness and poor appetite.  He denies any sick contact or recent travel except for going to [facility] 2 months back for his visit to the cardiologist and his defibrillator checked at that time.  He denies any new medications, sick contact.  Denies any headache, dizziness, blurred vision, chest pain, orthopnea or PND.  Denies any abdominal pain, dysuria or diarrhea.  Denies any tingling or numbness of his extremities, fall or trauma.  Denies any recent illness.    Clinical Impression  Patient functioning near baseline for functional mobility and gait, mostly limited for ambulation due to c/o fatigue, SpO2 remaining above 93% during activity while on room air, able to transfer to commode and tolerated sitting up in chair after therapy - RN notified.  Patient will benefit from continued physical therapy in hospital and recommended venue below to increase strength, balance, endurance for safe ADLs and gait.     Follow Up Recommendations Home health PT;Supervision - Intermittent    Equipment Recommendations  None recommended by PT    Recommendations for Other Services       Precautions / Restrictions Precautions Precautions: Fall Restrictions Weight Bearing  Restrictions: No      Mobility  Bed Mobility Overal bed mobility: Modified Independent             General bed mobility comments: increased time  Transfers Overall transfer level: Modified independent               General transfer comment: increased time  Ambulation/Gait Ambulation/Gait assistance: Supervision;Min guard Gait Distance (Feet): 12 Feet Assistive device: None Gait Pattern/deviations: Decreased step length - right;Decreased step length - left;Decreased stride length Gait velocity: decreased   General Gait Details: slightly labored movement without loss of balance, mostly limited due to fatigue, on room air with SpO2 between 93-97%  Stairs            Wheelchair Mobility    Modified Rankin (Stroke Patients Only)       Balance Overall balance assessment: Mild deficits observed, not formally tested                                           Pertinent Vitals/Pain Pain Assessment: No/denies pain    Home Living Family/patient expects to be discharged to:: Private residence Living Arrangements: Spouse/significant other Available Help at Discharge: Family Type of Home: House Home Access: Stairs to enter   Secretary/administrator of Steps: 1 Home Layout: One level Home Equipment: Cane - single point Additional Comments: presently his spouse is in hospital    Prior Function Level of Independence: Independent with assistive device(s)  Comments: household ambulator with [facility] PR[facility]     Hand Dominance        Extremity/Trunk Assessment   Upper Extremity Assessment Upper Extremity Assessment: Overall WFL for tasks assessed    Lower Extremity Assessment Lower Extremity Assessment: Generalized weakness    Cervical / Trunk Assessment Cervical / Trunk Assessment: Normal  Communication   Communication: No difficulties  Cognition Arousal/Alertness: Awake/alert Behavior During Therapy: WFL for tasks  assessed/performed Overall Cognitive Status: Within Functional Limits for tasks assessed                                        General Comments      Exercises     Assessment/Plan    PT Assessment Patient needs continued PT services  PT Problem List Decreased strength;Decreased activity tolerance;Decreased balance;Decreased mobility       PT Treatment Interventions Gait training;Stair training;Functional mobility training;Therapeutic activities;Therapeutic exercise;Patient/family education    PT Goals (Current goals can be found in the Care Plan section)  Acute Rehab PT Goals Patient Stated Goal: return home with family to assist PT Goal Formulation: With patient Time For Goal Achievement: 06/21/18 Potential to Achieve Goals: Good    Frequency Min 3X/week   Barriers to discharge        Co-evaluation               AM-PAC PT "6 Clicks" Mobility  Outcome Measure Help needed turning from your back to your side while in a flat bed without using bedrails?: None Help needed moving from lying on your back to sitting on the side of a flat bed without using bedrails?: None Help needed moving to and from a bed to a chair (including a wheelchair)?: None Help needed standing up from a chair using your arms (e.g., wheelchair or bedside chair)?: A Little Help needed to walk in hospital room?: A Little Help needed climbing 3-5 steps with a railing? : A Little 6 Click Score: 21    End of Session   Activity Tolerance: Patient tolerated treatment well;Patient limited by fatigue Patient left: in chair;with call bell/phone within reach Nurse Communication: Mobility status PT Visit Diagnosis: Unsteadiness on feet (R26.81);Other abnormalities of gait and mobility (R26.89);Muscle weakness (generalized) (M62.81)    Time: 1040-1103 PT Time Calculation (min) (ACUTE ONLY): 23 min   Charges:   PT Evaluation $PT Eval Moderate Complexity: 1 Mod PT  Treatments $Therapeutic Activity: 23-37 mins        2:48 PM, 06/14/18 Ocie Bob, MPT Physical Therapist with [facility] 336 [phone] office [phone] mobile phone

## 2018-06-14 NOTE — Progress Notes (Signed)
PROGRESS NOTE    Anthony Charles  GNO:[id number] DOB: 06-23-1945 DOA: 06/13/2018 PCP: Patient, No Pcp Per   Brief Narrative:  Per HPI: Anthony Charles  is a 73 y.o. male, [name] with history of CAD with history of MI s/p stenting in 2016 in [place], history of defibrillator and?  Pacemaker status, A. 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  He checked his temperature this morning and was 100 F.  Reports nausea but no vomiting.  Has poor appetite.  Patient reports progressively getting worse with increased weakness and poor appetite.  He denies any sick contact or recent travel except for going to [facility] 2 months back for his visit to the cardiologist and his defibrillator checked at that time.  He denies any new medications, sick contact.  Denies any headache, dizziness, blurred vision, chest pain, orthopnea or PND.  Denies any abdominal pain, dysuria or diarrhea.  Denies any tingling or numbness of his extremities, fall or trauma.  Denies any recent illness. He informs that he didn't take flu vaccine this season.  Patient admitted to stepdown for acute hypoxemic respiratory failure secondary to influenza A.  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 Acute hypoxemic respiratory failure secondary to influenza A.  Patient is also noted to have some  emphysematous changes with prior history of tobacco abuse.  I suspect that he does have some element of COPD with exacerbation for which I will initiate Pulmicort as well as scheduled breathing treatments and IV methylprednisolone and wean oxygen as tolerated.  We will also add antitussives as needed.  Continue other symptomatic treatment with IV fluid and closely monitor daily weights and intake/output due to history of CHF.  We will start to wean fluid once dietary intake stabilizes.  Maintain on Tamiflu for total 5 days.  Blood cultures pending.  Continue stepdown monitoring through today. 2. Sepsis with lactic acidosis secondary to above.  This appears to be improving with improvement in blood pressure and lactic acid levels noted after fluid bolus.  We will continue to monitor. 3. Hypokalemia.  Repleted.  Will monitor on repeat labs. 4. CAD with history of MI.  Follows with cardiologist in [place].  Patient does have history of 3 stents as well as pacemaker/defibrillator. 5. Hypothyroidism.  Maintain on Synthroid. 6. Chronic diastolic CHF.  We will continue to monitor strict I's and O's and daily weights and make sure IV fluid is time-limited as patient tolerates diet.  Does not appear to be in acute exacerbation currently. 7. Chronic atrial fibrillation.  Currently in sinus rhythm.  Will resume home metoprolol at this point in time his blood pressure has improved.  Continue amiodarone as well as Eliquis. 8. Protein calorie malnutrition.  Nutrition consult. 9. Generalized weakness.  PT evaluation pending.   DVT prophylaxis: Eliquis Code Status: Full code Family Communication: Wife at  bedside who is also sick Disposition Plan: Continue treatment of hypoxemia and influenza A.  Guarded condition.   Consultants:   None  Procedures:   None  Antimicrobials:   Tamiflu 3/11->   Subjective: Patient seen and evaluated today with ongoing chest tightness and shortness of breath.  He is  overall not feeling well and has to sit up on the side of the bed to prevent feeling nauseous.  Objective: Vitals:   06/14/18 0200 06/14/18 0300 06/14/18 0400 06/14/18 0500  BP: (!) 65/51 (!) 89/53 (!) 74/50   Pulse: 72 71 74 92  Resp: (!) 37 (!) 23 (!) 26 18  Temp:    98.4 F (36.9 C)  TempSrc:    Oral  SpO2: 98% 96% 99% 92%  Weight:      Height:        Intake/Output Summary (Last 24 hours) at 06/14/2018 0659 Last data filed at 06/13/2018 1854 Gross per 24 hour  Intake 1000 ml  Output -  Net 1000 ml   Filed Weights   06/13/18 1244 06/13/18 2148  Weight: 59.9 kg 59 kg    Examination:  General exam: Appears calm and comfortable, pale appearing. Respiratory system: Clear to auscultation. Respiratory effort normal.  Currently on 2 to 3 L nasal cannula with diminished breath sounds to bases. Cardiovascular system: S1 & S2 heard, RRR. No JVD, murmurs, rubs, gallops or clicks. No pedal edema. Gastrointestinal system: Abdomen is nondistended, soft and nontender. No organomegaly or masses felt. Normal bowel sounds heard. Central nervous system: Alert and oriented. No focal neurological deficits. Extremities: Symmetric 5 x 5 power. Skin: No rashes, lesions or ulcers Psychiatry: Judgement and insight appear normal. Mood & affect appropriate.     Data Reviewed: I have personally reviewed following labs and imaging studies  CBC: Recent Labs  Lab 06/13/18 1335  WBC 8.4  NEUTROABS 7.3  HGB 12.1*  HCT 34.5*  MCV 100.0  PLT 189   Basic Metabolic Panel: Recent Labs  Lab 06/13/18 1335 06/14/18 0524  NA 139 142  K 3.2* 3.7  CL 107 117*  CO2 23 17*  GLUCOSE 116* 136*  BUN 15 20  CREATININE 1.21 1.21  CALCIUM 8.7* 7.1*   GFR: Estimated Creatinine Clearance: 44 mL/min (by C-G formula based on SCr of 1.21 mg/dL). Liver Function Tests: No results for input(s): AST, ALT, ALKPHOS, BILITOT, PROT, ALBUMIN in the last 168 hours. No results for input(s): LIPASE, AMYLASE in the  last 168 hours. No results for input(s): AMMONIA in the last 168 hours. Coagulation Profile: Recent Labs  Lab 06/13/18 2149  INR 1.4*   Cardiac Enzymes: No results for input(s): CKTOTAL, CKMB, CKMBINDEX, TROPONINI in the last 168 hours. BNP (last 3 results) No results for input(s): PROBNP in the last 8760 hours. HbA1C: No results for input(s): HGBA1C in the last 72 hours. CBG: No results for input(s): GLUCAP in the last 168 hours. Lipid Profile: No results for input(s): CHOL, HDL, LDLCALC, TRIG, CHOLHDL, LDLDIRECT in the last 72 hours. Thyroid Function Tests: No results for input(s): TSH, T4TOTAL, FREET4, T3FREE, THYROIDAB in the last 72 hours. Anemia Panel: No results for input(s): VITAMINB12, FOLATE, FERRITIN, TIBC, IRON, RETICCTPCT in the last 72 hours. Sepsis Labs: Recent Labs  Lab 06/13/18 1335 06/13/18 1527 06/13/18 2149 06/14/18 0116  PROCALCITON  --   --  0.16  --   LATICACIDVEN 1.8 1.6 4.8* 1.8    Recent Results (from the past 240 hour(s))  Blood culture (routine x 2)  Status: None (Preliminary result)   Collection Time: 06/13/18  1:20 PM  Result Value Ref Range Status   Specimen Description RIGHT ANTECUBITAL  Final   Special Requests   Final    BOTTLES DRAWN AEROBIC AND ANAEROBIC Blood Culture adequate volume Performed at [facility], [address] [place], [place] [address]    Culture PENDING  Incomplete   Report Status PENDING  Incomplete  Blood culture (routine x 2)     Status: None (Preliminary result)   Collection Time: 06/13/18  1:45 PM  Result Value Ref Range Status   Specimen Description LEFT ANTECUBITAL  Final   Special Requests   Final    BOTTLES DRAWN AEROBIC AND ANAEROBIC Blood Culture adequate volume Performed at [facility], [address] [place], [place] [address]    Culture PENDING  Incomplete   Report Status PENDING  Incomplete  MRSA PCR Screening     Status: None   Collection Time: 06/13/18  9:37 PM  Result Value Ref Range  Status   MRSA by PCR NEGATIVE NEGATIVE Final    Comment:        The GeneXpert MRSA Assay (FDA approved for NASAL specimens only), is one component of a comprehensive MRSA colonization surveillance program. It is not intended to diagnose MRSA infection nor to guide or monitor treatment for MRSA infections. Performed at [facility], [address], [place], [place] [address]          Radiology Studies: Dg Chest 2 View  Result Date: 06/13/2018 CLINICAL DATA:  Weakness, body aches, cough EXAM: CHEST - 2 VIEW COMPARISON:  None. FINDINGS: Pulmonary hyperinflation and probable emphysematous change. No acute abnormality of the lungs. Left chest multi lead pacer defibrillator. Cardiomegaly. Disc degenerative disease of the thoracic spine. IMPRESSION: Pulmonary hyperinflation and probable emphysematous change. No acute appearing airspace opacity. Cardiomegaly. Electronically Signed   By: Lauralyn Primes M.D.   On: 06/13/2018 14:45        Scheduled Meds: . amiodarone  100 mg Oral Daily  . apixaban  5 mg Oral BID  . atorvastatin  20 mg Oral QHS  . clopidogrel  75 mg Oral q morning - 10a  . dextromethorphan-guaiFENesin  1 tablet Oral BID  . levothyroxine  112 mcg Oral q morning - 10a  . loratadine  10 mg Oral Daily  . multivitamin with minerals  1 tablet Oral Daily  . oseltamivir  75 mg Oral BID  . traZODone  200 mg Oral QHS   Continuous Infusions: . sodium chloride 100 mL/hr at 06/14/18 0508     LOS: 1 day    Time spent: 30 minutes    Derrian Poli Hoover Brunette, DO [facility] Pager [phone]  If 7PM-7AM, please contact night-coverage [email or web address] Password [facility] 06/14/2018, 6:59 AM

## 2018-06-14 NOTE — Progress Notes (Signed)
PHARMACY NOTE:  ANTIMICROBIAL RENAL DOSAGE ADJUSTMENT  Current anti-viral regimen includes a mismatch between antimicrobial dosage and estimated renal function.  As per policy approved by the Pharmacy & Therapeutics and Medical Executive Committees, the anti-viral dosage will be adjusted accordingly.  Current anti-viral dosage:  oseltamivir 75mg  bid  Indication: Flu A +  Renal Function:  Estimated Creatinine Clearance: 44 mL/min (by C-G formula based on SCr of 1.21 mg/dL). []      On intermittent HD, scheduled: []      On CRRT    Antimicrobial dosage has been changed to:   oseltamivir 30mg  bid     Thank you for allowing pharmacy to be a part of this patient's care.  Tama High, [facility] 06/14/2018 12:24 PM

## 2018-06-14 NOTE — Progress Notes (Signed)
Initial Nutrition Assessment  DOCUMENTATION CODES:   Non-severe (moderate) malnutrition in context of chronic illness, Underweight  INTERVENTION:  Ensure Enlive po BID, each supplement provides 350 kcal and 20 grams of protein Snacks in between meals to accommodate likes (pt likes fruit, assorted sandwiches, and chips) MVI  Possible that patient is at risk for refeeding based off of dietary recall. Patient reports only H20 x 3 days.   NUTRITION DIAGNOSIS:   Moderate Malnutrition related to chronic illness( CHF, reported hx of agent orange exposure) as evidenced by severe fat depletion, moderate muscle depletion.  GOAL:   Patient will meet greater than or equal to 90% of their needs   MONITOR:   PO intake, Weight trends, Supplement acceptance, Labs  REASON FOR ASSESSMENT:   Consult Assessment of nutrition requirement/status  ASSESSMENT:  73 year old [place] [employer] Veteran admitted with  and acute respiratory failure with hypoxia secondary to Influenza A. PMH includes CAD, MI s/p 3 stents, CHF pacemaker/defibrillator, agent orange exposure, T2DM   No weight history available to review, patient noted to be 130 lbs.   Patient sitting in chair at visit appears pale and reports feeling tired. He reports that his wife had recently been taken from his room and admitted inpatient for the flu. Patient stated that he came down with the flu on his birthday 3 days ago and has been unable to eat during this time. He reports only PO of water since admission. Suggested a variety of ONS to patient and he stated that he is lactose intolerant and ingestion causes him to have nausea and vomiting. Intolerance was updated into [facility]. Patient reports liking cheezits, sandwiches, and fresh fruit.   Patient recalls 127-130 lbs as UBW. Prior to his service in the [employer] [place] [employer], pt recalls 165 lbs. 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   Medications reviewed and include: Mucinex DM, MVI, Tamiflu, Zofran  Labs: Glucose 136 (H) Previous hypokalemia resolved   NUTRITION - FOCUSED PHYSICAL EXAM:    Most Recent Value  Orbital Region  Severe depletion  Upper Arm Region  Moderate depletion  Thoracic and Lumbar Region  Moderate depletion  Buccal Region  Severe depletion  Temple Region  Severe depletion  Clavicle Bone Region  Severe depletion  Clavicle and Acromion Bone Region  Moderate depletion  Scapular Bone Region  Moderate depletion  Dorsal Hand  Moderate depletion  Patellar Region  Moderate depletion  Anterior Thigh Region  Unable to assess  Posterior Calf Region  Moderate depletion  Edema (RD Assessment)  None  Hair  Reviewed  Eyes  Reviewed  Mouth  Reviewed [dry]  Skin  Reviewed [pale]  Nails  Reviewed       Diet Order:   Diet Order            Diet Heart Room service appropriate? Yes; Fluid consistency: Thin  Diet effective now              EDUCATION NEEDS:   No education needs have been identified at this time  Skin:  Skin Assessment: Reviewed RN Assessment(ecchymosis; rt arm, elbow)  Last BM:  3/12; type 6 (brown, med)  Height:   Ht Readings from Last 1 Encounters:  06/13/18 5\' 10"  (1.778 m)    Weight: 129.8 lb  Wt Readings from Last 1 Encounters:  06/13/18 59 kg  Ideal Body Weight:  75.5 kg 166 lb  BMI:  Body mass index is 18.66 kg/m.  Estimated Nutritional Needs:   Kcal:  1775-1900  Protein:  77-94  Fluid:  1.7-1.9L    Lars Masson, RD, LDN  After Hours/Weekend Pager: [phone]

## 2018-06-14 NOTE — Plan of Care (Signed)
  Problem: Acute Rehab PT Goals(only PT should resolve) Goal: Patient Will Transfer Sit To/From Stand Outcome: Progressing Flowsheets (Taken 06/14/2018 1450) Patient will transfer sit to/from stand: Independently Goal: Pt Will Transfer Bed To Chair/Chair To Bed Outcome: Progressing Flowsheets (Taken 06/14/2018 1450) Pt will Transfer Bed to Chair/Chair to Bed: Independently Goal: Pt Will Ambulate Outcome: Progressing Flowsheets (Taken 06/14/2018 1450) Pt will Ambulate: 50 feet; with modified independence; with cane Note:  Use SPC if necessary   2:51 PM, 06/14/18 Ocie Bob, MPT Physical Therapist with [facility] [phone] office [phone] mobile phone

## 2018-06-15 LAB — COMPREHENSIVE METABOLIC PANEL
ALBUMIN: 2.7 g/dL — AB (ref 3.5–5.0)
ALT: 26 U/L (ref 0–44)
AST: 57 U/L — ABNORMAL HIGH (ref 15–41)
Alkaline Phosphatase: 76 U/L (ref 38–126)
Anion gap: 5 (ref 5–15)
BUN: 25 mg/dL — ABNORMAL HIGH (ref 8–23)
CO2: 19 mmol/L — AB (ref 22–32)
Calcium: 7.5 mg/dL — ABNORMAL LOW (ref 8.9–10.3)
Chloride: 119 mmol/L — ABNORMAL HIGH (ref 98–111)
Creatinine, Ser: 1.02 mg/dL (ref 0.61–1.24)
GFR calc Af Amer: >60 mL/min (ref >60)
GFR calc non Af Amer: >60 mL/min (ref >60)
Glucose, Bld: 128 mg/dL — ABNORMAL HIGH (ref 70–99)
Potassium: 4.2 mmol/L (ref 3.5–5.1)
Sodium: 143 mmol/L (ref 135–145)
Total Bilirubin: 0.6 mg/dL (ref 0.3–1.2)
Total Protein: 5.2 g/dL — ABNORMAL LOW (ref 6.5–8.1)

## 2018-06-15 LAB — CBC
HCT: 29.9 % — ABNORMAL LOW (ref 39.0–52.0)
Hemoglobin: 9.5 g/dL — ABNORMAL LOW (ref 13.0–17.0)
MCH: 32.3 pg (ref 26.0–34.0)
MCHC: 31.8 g/dL (ref 30.0–36.0)
MCV: 101.7 fL — ABNORMAL HIGH (ref 80.0–100.0)
Platelets: 134 10*3/uL — ABNORMAL LOW (ref 150–400)
RBC: 2.94 MIL/uL — AB (ref 4.22–5.81)
RDW: 13.4 % (ref 11.5–15.5)
WBC: 6 10*3/uL (ref 4.0–10.5)
nRBC: 0 % (ref 0.0–0.2)

## 2018-06-15 LAB — LACTIC ACID, PLASMA: Lactic Acid, Venous: 1.2 mmol/L (ref 0.5–1.9)

## 2018-06-15 MED ORDER — METOPROLOL TARTRATE 5 MG/5ML IV SOLN
INTRAVENOUS | Status: AC
  Filled 2018-06-15: qty 5

## 2018-06-15 MED ORDER — METOPROLOL TARTRATE 5 MG/5ML IV SOLN
5.0000 mg | Freq: Once | INTRAVENOUS | Status: AC
  Administered 2018-06-15: 5 mg via INTRAVENOUS

## 2018-06-15 MED ORDER — DILTIAZEM HCL 100 MG IV SOLR
5.0000 mg/h | INTRAVENOUS | Status: DC
  Filled 2018-06-15: qty 100

## 2018-06-15 MED ORDER — DILTIAZEM LOAD VIA INFUSION
10.0000 mg | Freq: Once | INTRAVENOUS | Status: DC
  Filled 2018-06-15: qty 10

## 2018-06-15 NOTE — Care Management Important Message (Signed)
Important Message  Patient Details  Name: Anthony Charles MRN: [id number] Date of Birth: 04-Dec-1945   Medicare Important Message Given:  Yes    Corey Harold 06/15/2018, 11:28 AM

## 2018-06-15 NOTE — TOC Initial Note (Signed)
Transition of Care [facility]) - Initial/Assessment Note    Patient Details  Name: Anthony Charles MRN: [id number] Date of Birth: 1945-06-27  [facility] CM/SW Contact:    Shade Flood, LCSW Phone Number: 06/15/2018, 11:28 AM  Clinical Narrative:  Met with pt to review PT recommendation for [facility] PT. Answered pt's questions. Ultimately pt stated that he does not think he needs the [facility] PT and does not want anything arranged. Informed pt LCSW could return if he changes his mind.            Expected Discharge Plan: Home/Self Care Barriers to Discharge: No Barriers Identified   Patient Goals and CMS Choice Patient states their goals for this hospitalization and ongoing recovery are:: return home independently CMS Medicare.gov Compare Post Acute Care list provided to:: Patient Choice offered to / list presented to : Patient  Expected Discharge Plan and Services Expected Discharge Plan: Home/Self Care     Living arrangements for the past 2 months: Single Family Home                          Prior Living Arrangements/Services Living arrangements for the past 2 months: Single Family Home Lives with:: Spouse Patient language and need for interpreter reviewed:: Yes Do you feel safe going back to the place where you live?: Yes      Need for Family Participation in Patient Care: No (Comment) Care giver support system in place?: Yes (comment)   Criminal Activity/Legal Involvement Pertinent to Current Situation/Hospitalization: No - Comment as needed  Activities of Daily Living Home Assistive Devices/Equipment: Grab bars around toilet, Grab bars in shower, Shower chair with back, Eyeglasses, Dentures (specify type)(upper and lower) ADL Screening (condition at time of admission) Patient's cognitive ability adequate to safely complete daily activities?: Yes Is the patient deaf or have difficulty hearing?: No Does the patient have difficulty seeing, even when wearing  glasses/contacts?: No Does the patient have difficulty concentrating, remembering, or making decisions?: No Patient able to express need for assistance with ADLs?: Yes Does the patient have difficulty dressing or bathing?: No Independently performs ADLs?: Yes (appropriate for developmental age) Does the patient have difficulty walking or climbing stairs?: No Weakness of Legs: None Weakness of Arms/Hands: None  Permission Sought/Granted                  Emotional Assessment Appearance:: Appears stated age Attitude/Demeanor/Rapport: Engaged Affect (typically observed): Pleasant Orientation: : Oriented to Self, Oriented to Place, Oriented to Situation Alcohol / Substance Use: Not Applicable Psych Involvement: No (comment)  Admission diagnosis:  Influenza A [J10.1] Patient Active Problem List   Diagnosis Date Noted  . Sepsis ([place]) 06/13/2018  . Influenza A 06/13/2018  . Acute respiratory failure with hypoxia ([place]) 06/13/2018  . CAD in native artery 06/13/2018  . Hx of myocardial infarction 06/13/2018  . H/O agent Orange exposure 06/13/2018  . Protein calorie malnutrition ([place]) 06/13/2018  . Generalized weakness 06/13/2018  . Hypokalemia 06/13/2018  . Chronic diastolic CHF (congestive heart failure) ([place]) 06/13/2018  . Atrial fibrillation, chronic 06/13/2018  . 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 The score is based on dignosis, age, lab data, medications, orders, and  past utilization.   Low:  0-14.9   Medium: 15-21.9   High: 22-29.9   Extreme: 30 and above       No flowsheet data found.

## 2018-06-15 NOTE — Progress Notes (Signed)
1930: Pt called out to RN- HR noted to be 150-170. MD made aware, 5 mg metoprolol IV given as ordered.   HR currently 125-130. Additionally, pt reporting mild (4/10) pain in lower right abdomen when coughing. Dr. Adrian Blackwater made aware of both findings above.

## 2018-06-15 NOTE — Progress Notes (Signed)
PROGRESS NOTE    Anthony Charles  ZOX:[id number] DOB: 1945/07/25 DOA: 06/13/2018 PCP: Patient, No Pcp Per   Brief Narrative:  Per HPI: [name] a73 y.o.male,[place] War veteran with history of CAD with history of MI s/p stenting in 2016 in [place], history of defibrillator and? Pacemaker status, A. 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 He checked his temperature this morning and was 100 F. Reports nausea but no vomiting. Has poor appetite. Patient reports progressively getting worse with increased weakness and poor appetite. He denies any sick contact or recent travel except for going to [facility] 2 months back for his visit to the cardiologist and his defibrillator checked at that time. He denies any new medications, sick contact. Denies any headache, dizziness, blurred vision, chest pain, orthopnea or PND. Denies any abdominal pain, dysuria or diarrhea. Denies any tingling or numbness of his extremities, fall or trauma. Denies any recent illness. He informs that he didn't take flu vaccine this season.  Patient admitted to stepdown for acute hypoxemic respiratory failure secondary to influenza A.   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 Acute hypoxemic respiratory failure secondary to influenza A-improving.  Patient is also noted to have  some emphysematous changes with prior history of tobacco abuse.  I suspect that he does have some element of COPD with exacerbation for which I will continue Pulmicort as well as scheduled breathing treatments and IV methylprednisolone and wean oxygen as tolerated.  We will also add antitussives as needed.    No need for IV fluid at this time as he is tolerating diet and appears to be euvolemic.  Maintain on Tamiflu for total 5 days.  Blood cultures pending, but negative growth in 24 hours.    Transfer to telemetry today. 2. Sepsis with lactic acidosis secondary to above.  This appears to be improving with improvement in blood pressure and lactic acid levels noted after fluid bolus.  We will continue to monitor. 3. Hypokalemia.  Repleted.  Will monitor on repeat labs. 4. CAD with history of MI.  Follows with cardiologist in [place].  Patient does have history of 3 stents as well as pacemaker/defibrillator. 5. Hypothyroidism.  Maintain on Synthroid. 6. Chronic diastolic CHF.  We will continue to monitor strict I's and O's and daily weights and make sure IV fluid is time-limited as patient tolerates diet.  Does not appear to be in acute exacerbation currently and net balance is up 400 amount. 7. Chronic atrial fibrillation.  Currently in sinus rhythm.  Will transfer to telemetry today. Continue, metoprolol, amiodarone as well as Eliquis. 8. Moderate protein calorie malnutrition.  Appreciate nutrition recommendations. 9. Generalized weakness.  PT evaluation with recommendations for home health PT.   DVT prophylaxis: Eliquis Code Status: Full code Family Communication:  None currently at bedside. Disposition Plan: Continue treatment of hypoxemia and influenza A.    Continue  current treatments and wean oxygen.  Anticipate discharge in the next 24 to 48 hours.  Transfer to telemetry.   Consultants:   None  Procedures:   None  Antimicrobials:   Tamiflu 3/11->   Subjective: Patient  seen and evaluated today with no new acute complaints or concerns. No acute concerns or events noted overnight.  He is still not back to baseline and continues to have some mild shortness of breath, but this is much improved.  He has been tolerating diet with no significant nausea noted.  Objective: Vitals:   06/15/18 0316 06/15/18 0400 06/15/18 0500 06/15/18 0600  BP:  122/83 129/79   Pulse:  73 72 78  Resp:  (!) 30 18 (!) 22  Temp:  99.6 F (37.6 C)    TempSrc:  Oral    SpO2: 96% 95% 95% 95%  Weight:   64.3 kg   Height:        Intake/Output Summary (Last 24 hours) at 06/15/2018 0700 Last data filed at 06/15/2018 0500 Gross per 24 hour  Intake 1077.1 ml  Output 600 ml  Net 477.1 ml   Filed Weights   06/13/18 1244 06/13/18 2148 06/15/18 0500  Weight: 59.9 kg 59 kg 64.3 kg    Examination:  General exam: Appears calm and comfortable  Respiratory system: Clear to auscultation. Respiratory effort normal.  Currently on 2 L nasal cannula.  Minimal wheezing noted at bilateral bases. Cardiovascular system: S1 & S2 heard, RRR. No JVD, murmurs, rubs, gallops or clicks. No pedal edema. Gastrointestinal system: Abdomen is nondistended, soft and nontender. No organomegaly or masses felt. Normal bowel sounds heard. Central nervous system: Alert and oriented. No focal neurological deficits. Extremities: Symmetric 5 x 5 power. Skin: No rashes, lesions or ulcers Psychiatry: Judgement and insight appear normal. Mood & affect appropriate.     Data Reviewed: I have personally reviewed following labs and imaging studies  CBC: Recent Labs  Lab 06/13/18 1335 06/14/18 0524 06/15/18 0413  WBC 8.4 6.3 6.0  NEUTROABS 7.3  --   --   HGB 12.1* 9.8* 9.5*  HCT 34.5* 32.0* 29.9*  MCV 100.0 105.6* 101.7*  PLT 189 122* 134*   Basic Metabolic Panel: Recent Labs  Lab 06/13/18 1335 06/14/18 0524 06/15/18 0413  NA 139 142 143  K 3.2* 3.7 4.2  CL 107 117* 119*  CO2 23 17* 19*  GLUCOSE 116*  136* 128*  BUN 15 20 25*  CREATININE 1.21 1.21 1.02  CALCIUM 8.7* 7.1* 7.5*   GFR: Estimated Creatinine Clearance: 56.9 mL/min (by C-G formula based on SCr of 1.02 mg/dL). Liver Function Tests: Recent Labs  Lab 06/15/18 0413  AST 57*  ALT 26  ALKPHOS 76  BILITOT 0.6  PROT 5.2*  ALBUMIN 2.7*   No results for input(s): LIPASE, AMYLASE in the last 168 hours. No results for input(s): AMMONIA in the last 168 hours. Coagulation Profile: Recent Labs  Lab 06/13/18 2149  INR 1.4*   Cardiac Enzymes: No results for input(s): CKTOTAL, CKMB, CKMBINDEX, TROPONINI in the last 168 hours. BNP (last 3 results) No results for input(s): PROBNP in the last 8760 hours. HbA1C: No results for input(s): HGBA1C in the last 72 hours. CBG: No results for input(s): GLUCAP in the last 168 hours. Lipid Profile: No results for input(s): CHOL, HDL, LDLCALC, TRIG, CHOLHDL, LDLDIRECT in the last 72 hours. Thyroid Function Tests: No results for input(s): TSH, T4TOTAL, FREET4, T3FREE, THYROIDAB in the last 72 hours. Anemia Panel: No results for input(s):  VITAMINB12, FOLATE, FERRITIN, TIBC, IRON, RETICCTPCT in the last 72 hours. Sepsis Labs: Recent Labs  Lab 06/13/18 2149 06/14/18 0116 06/14/18 2313 06/15/18 0413  PROCALCITON 0.16  --   --   --   LATICACIDVEN 4.8* 1.8 1.1 1.2    Recent Results (from the past 240 hour(s))  Blood culture (routine x 2)     Status: None (Preliminary result)   Collection Time: 06/13/18  1:20 PM  Result Value Ref Range Status   Specimen Description RIGHT ANTECUBITAL  Final   Special Requests   Final    BOTTLES DRAWN AEROBIC AND ANAEROBIC Blood Culture adequate volume   Culture   Final    NO GROWTH < 24 HOURS Performed at [facility], [address] [place], [place] [address]    Report Status PENDING  Incomplete  Blood culture (routine x 2)     Status: None (Preliminary result)   Collection Time: 06/13/18  1:45 PM  Result Value Ref Range Status   Specimen  Description LEFT ANTECUBITAL  Final   Special Requests   Final    BOTTLES DRAWN AEROBIC AND ANAEROBIC Blood Culture adequate volume   Culture   Final    NO GROWTH < 24 HOURS Performed at [facility], [address] [place], [place] [address]    Report Status PENDING  Incomplete  MRSA PCR Screening     Status: None   Collection Time: 06/13/18  9:37 PM  Result Value Ref Range Status   MRSA by PCR NEGATIVE NEGATIVE Final    Comment:        The GeneXpert MRSA Assay (FDA approved for NASAL specimens only), is one component of a comprehensive MRSA colonization surveillance program. It is not intended to diagnose MRSA infection nor to guide or monitor treatment for MRSA infections. Performed at [facility], [address] [place], [place] [address]          Radiology Studies: Dg Chest 2 View  Result Date: 06/13/2018 CLINICAL DATA:  Weakness, body aches, cough EXAM: CHEST - 2 VIEW COMPARISON:  None. FINDINGS: Pulmonary hyperinflation and probable emphysematous change. No acute abnormality of the lungs. Left chest multi lead pacer defibrillator. Cardiomegaly. Disc degenerative disease of the thoracic spine. IMPRESSION: Pulmonary hyperinflation and probable emphysematous change. No acute appearing airspace opacity. Cardiomegaly. Electronically Signed   By: Lauralyn Primes M.D.   On: 06/13/2018 14:45        Scheduled Meds: . amiodarone  100 mg Oral Daily  . apixaban  5 mg Oral BID  . atorvastatin  20 mg Oral QHS  . budesonide (PULMICORT) nebulizer solution  0.25 mg Nebulization BID  . clopidogrel  75 mg Oral q morning - 10a  . dextromethorphan-guaiFENesin  1 tablet Oral BID  . feeding supplement (ENSURE ENLIVE)  237 mL Oral BID BM  . ipratropium  0.5 mg Nebulization Q6H  . levalbuterol  0.63 mg Nebulization Q6H  . levothyroxine  112 mcg Oral q morning - 10a  . loratadine  10 mg Oral Daily  . methylPREDNISolone (SOLU-MEDROL) injection  40 mg Intravenous Q12H  . metoprolol  succinate  50 mg Oral Daily  . multivitamin with minerals  1 tablet Oral Daily  . oseltamivir  30 mg Oral BID  . traZODone  200 mg Oral QHS   Continuous Infusions:   LOS: 2 days    Time spent: 30 minutes    Kamya Watling Hoover Brunette, DO [facility] Pager [phone]  If 7PM-7AM, please contact night-coverage [email or web address] Password [email or web address]  06/15/2018, 7:00 AM

## 2018-06-16 DIAGNOSIS — E440 Moderate protein-calorie malnutrition: Secondary | ICD-10-CM

## 2018-06-16 MED ORDER — ENSURE ENLIVE PO LIQD: 237.0000 mL | Freq: Two times a day (BID) | ORAL | 12 refills | Status: AC

## 2018-06-16 MED ORDER — DM-GUAIFENESIN ER 30-600 MG PO TB12: 1.0000 | ORAL_TABLET | Freq: Two times a day (BID) | ORAL | 0 refills | Status: DC

## 2018-06-16 MED ORDER — OSELTAMIVIR PHOSPHATE 30 MG PO CAPS: 30.0000 mg | ORAL_CAPSULE | Freq: Two times a day (BID) | ORAL | 0 refills | Status: AC

## 2018-06-16 MED ORDER — IPRATROPIUM-ALBUTEROL 20-100 MCG/ACT IN AERS: 1.0000 | INHALATION_SPRAY | Freq: Four times a day (QID) | RESPIRATORY_TRACT | 3 refills | Status: DC | PRN

## 2018-06-16 MED ORDER — IPRATROPIUM-ALBUTEROL 0.5-2.5 (3) MG/3ML IN SOLN: 3.0000 mL | Freq: Four times a day (QID) | RESPIRATORY_TRACT | 3 refills | Status: DC | PRN

## 2018-06-16 MED ORDER — CHLORHEXIDINE GLUCONATE CLOTH 2 % EX PADS: 6.0000 | MEDICATED_PAD | Freq: Every day | CUTANEOUS | Status: DC

## 2018-06-16 MED ORDER — PREDNISONE 20 MG PO TABS: 40.0000 mg | ORAL_TABLET | Freq: Every day | ORAL | 0 refills | Status: DC

## 2018-06-16 NOTE — Progress Notes (Signed)
Being discharged to home. IV access removed and dressed himself. Patient stated he will have to wait for ride and he may want to go to ER due to EMS at his home with his wife.

## 2018-06-16 NOTE — Discharge Summary (Signed)
Physician Discharge Summary  Anthony Charles WGN:[id number] DOB: 23-Apr-1945 DOA: 06/13/2018  PCP: Patient, No Pcp Per  Admit date: 06/13/2018  Discharge date: 06/16/2018  Admitted From:Home  Disposition:  Home  Recommendations for Outpatient Follow-up:  1. Follow up with PCP in 1-2 weeks 2. Continue on prednisone and Tamiflu as prescribed to finish course of treatment 3. Patient has been given Combivent as well as nebulizer machine with duo nebs as needed for shortness of breath or wheezing  Home Health: None  Equipment/Devices: None  Discharge Condition: Stable  CODE STATUS: Full  Diet recommendation: Heart Healthy  Brief/Interim Summary:Per HPI: [name] a73 y.o.male,[employer] [employer] veteran with history of CAD with history of MI s/p stenting in 2016 in [place], history of defibrillator and? Pacemaker status, A. 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 He checked his temperature this morning and was 100 F. Reports nausea but no vomiting. Has poor appetite. Patient reports progressively getting worse with increased weakness and poor appetite. He denies any sick contact or recent travel except for going to [facility] 2 months back for his visit to the cardiologist and his defibrillator checked at that time. He denies any new medications, sick contact. Denies any headache, dizziness, blurred vision, chest pain, orthopnea or PND. Denies any abdominal pain, dysuria or diarrhea. Denies any tingling or numbness of his extremities, fall or trauma. Denies any recent illness. He informs that he didn't take flu vaccine this season.  Patient admitted to stepdown for acute hypoxemic respiratory failure secondary to influenza A.  He has done quite well with treatment on IV  steroids, Pulmicort, breathing treatments, and Tamiflu.  His symptoms have now mostly resolved and he is no longer hypoxemic.  He is stable for discharge and will continue Tamiflu for 2 more days as well as prednisone which has been started for 5 more days.  He has ambulated with no significant respiratory issues noted and has had no other acute events during the course of this admission.   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  Discharge Instructions  Discharge Instructions    DME Nebulizer machine   Complete by:  As directed    Patient needs a nebulizer to treat with the following condition:  COPD (chronic obstructive pulmonary disease) (HCC)   Diet - low sodium heart healthy   Complete by:  As directed    Increase activity slowly   Complete by:  As directed      Allergies as of 06/16/2018   No Known Allergies     Medication List    TAKE these medications   amiodarone 200 MG tablet Commonly known as:  PACERONE Take 100 mg by mouth daily.   atorvastatin 20 MG tablet Commonly known as:  LIPITOR Take 20 mg by mouth at bedtime.  cetirizine 10 MG tablet Commonly known as:  ZYRTEC Take 5 mg by mouth daily.   clopidogrel 75 MG tablet Commonly known as:  PLAVIX Take 75 mg by mouth every morning.   dextromethorphan-guaiFENesin 30-600 MG 12hr tablet Commonly known as:  MUCINEX DM Take 1 tablet by mouth 2 (two) times daily for 10  days.   Eliquis 5 MG Tabs tablet Generic drug:  apixaban Take 5 mg by mouth 2 (two) times daily.   feeding supplement (ENSURE ENLIVE) Liqd Take 237 mLs by mouth 2 (two) times daily between meals.   ibuprofen 600 MG tablet Commonly known as:  ADVIL,MOTRIN Take 600 mg by mouth every 6 (six) hours as needed for mild pain or moderate pain.   Ipratropium-Albuterol 20-100 MCG/ACT Aers respimat Commonly known as:  Combivent Respimat Inhale 1 puff into the lungs every 6 (six) hours as needed for wheezing or shortness of breath.   ipratropium-albuterol 0.5-2.5 (3) MG/3ML Soln Commonly known as:  DUONEB Take 3 mLs by nebulization every 6 (six) hours as needed (dyspnea or wheezing).   levothyroxine 112 MCG tablet Commonly known as:  SYNTHROID, LEVOTHROID Take 112 mcg by mouth every morning.   metoprolol succinate 100 MG 24 hr tablet Commonly known as:  TOPROL-XL Take 50 mg by mouth daily. Take with or immediately following a meal.   multivitamin with minerals Tabs tablet Take 1 tablet by mouth daily.   oseltamivir 75 MG capsule Commonly known as:  TAMIFLU Take 1 capsule (75 mg total) by mouth every 12 (twelve) hours.   oseltamivir 30 MG capsule Commonly known as:  TAMIFLU Take 1 capsule (30 mg total) by mouth 2 (two) times daily for 2 days.   predniSONE 20 MG tablet Commonly known as:  Deltasone Take 2 tablets (40 mg total) by mouth daily for 5 days.   promethazine 25 MG tablet Commonly known as:  PHENERGAN Take 25 mg by mouth every 6 (six) hours as needed for nausea or vomiting.   traZODone 100 MG tablet Commonly known as:  DESYREL Take 200 mg by mouth at bedtime.            Durable Medical Equipment  (From admission, onward)         Start     Ordered   06/16/18 0000  DME Nebulizer machine    Question:  Patient needs a nebulizer to treat with the following condition  Answer:  COPD (chronic obstructive pulmonary disease) (HCC)   06/16/18 0933          Follow-up Information    pcp Follow up in 1 week(s).          No Known Allergies  Consultations:  None   Procedures/Studies: Dg Chest 2 View  Result Date: 06/13/2018 CLINICAL DATA:  Weakness, body aches, cough EXAM: CHEST - 2 VIEW COMPARISON:  None. FINDINGS: Pulmonary hyperinflation and probable emphysematous change. No acute abnormality of the lungs. Left chest multi lead pacer defibrillator. Cardiomegaly. Disc degenerative disease of the thoracic spine. IMPRESSION: Pulmonary hyperinflation and probable emphysematous change. No acute appearing airspace opacity. Cardiomegaly. Electronically Signed   By: Lauralyn Primes M.D.   On: 06/13/2018 14:45     Discharge Exam: Vitals:   06/16/18 0800 06/16/18 0845  BP:    Pulse: 82   Resp: (!) 23   Temp:    SpO2: 91% 93%   Vitals:   06/16/18 0700 06/16/18 0715 06/16/18 0800 06/16/18 0845  BP: 127/79     Pulse: 73  82  Resp: (!) 25  (!) 23   Temp:  97.7 F (36.5 C)    TempSrc:  Oral    SpO2: 91%  91% 93%  Weight:      Height:        General: Pt is alert, awake, not in acute distress Cardiovascular: RRR, S1/S2 +, no rubs, no gallops Respiratory: CTA bilaterally, no wheezing, no rhonchi Abdominal: Soft, NT, ND, bowel sounds + Extremities: no edema, no cyanosis    The results of significant diagnostics from this hospitalization (including imaging, microbiology, ancillary and laboratory) are listed below for reference.     Microbiology: Recent Results (from the past 240 hour(s))  Blood culture (routine x 2)     Status: None (Preliminary result)   Collection Time: 06/13/18  1:20 PM  Result Value Ref Range Status   Specimen Description RIGHT ANTECUBITAL  Final   Special Requests   Final    BOTTLES DRAWN AEROBIC AND ANAEROBIC Blood Culture adequate volume   Culture   Final    NO GROWTH 3 DAYS Performed at [facility], [address] [place], [place] [address]    Report Status PENDING  Incomplete  Blood culture  (routine x 2)     Status: None (Preliminary result)   Collection Time: 06/13/18  1:45 PM  Result Value Ref Range Status   Specimen Description LEFT ANTECUBITAL  Final   Special Requests   Final    BOTTLES DRAWN AEROBIC AND ANAEROBIC Blood Culture adequate volume   Culture   Final    NO GROWTH 3 DAYS Performed at [facility], [address], [place], [place] [address]    Report Status PENDING  Incomplete  MRSA PCR Screening     Status: None   Collection Time: 06/13/18  9:37 PM  Result Value Ref Range Status   MRSA by PCR NEGATIVE NEGATIVE Final    Comment:        The GeneXpert MRSA Assay (FDA approved for NASAL specimens only), is one component of a comprehensive MRSA colonization surveillance program. It is not intended to diagnose MRSA infection nor to guide or monitor treatment for MRSA infections. Performed at [facility], [address], [place], [place] [address]      Labs: BNP (last 3 results) No results for input(s): BNP in the last 8760 hours. Basic Metabolic Panel: Recent Labs  Lab 06/13/18 1335 06/14/18 0524 06/15/18 0413  NA 139 142 143  K 3.2* 3.7 4.2  CL 107 117* 119*  CO2 23 17* 19*  GLUCOSE 116* 136* 128*  BUN 15 20 25*  CREATININE 1.21 1.21 1.02  CALCIUM 8.7* 7.1* 7.5*   Liver Function Tests: Recent Labs  Lab 06/15/18 0413  AST 57*  ALT 26  ALKPHOS 76  BILITOT 0.6  PROT 5.2*  ALBUMIN 2.7*   No results for input(s): LIPASE, AMYLASE in the last 168 hours. No results for input(s): AMMONIA in the last 168 hours. CBC: Recent Labs  Lab 06/13/18 1335 06/14/18 0524 06/15/18 0413  WBC 8.4 6.3 6.0  NEUTROABS 7.3  --   --   HGB 12.1* 9.8* 9.5*  HCT 34.5* 32.0* 29.9*  MCV 100.0 105.6* 101.7*  PLT 189 122* 134*   Cardiac Enzymes: No results for input(s): CKTOTAL, CKMB, CKMBINDEX, TROPONINI in the last 168 hours. BNP: Invalid input(s): POCBNP CBG: No results for input(s): GLUCAP in the last 168 hours. D-Dimer No results for input(s):  DDIMER in the last 72 hours. Hgb A1c No results for input(s): HGBA1C  in the last 72 hours. Lipid Profile No results for input(s): CHOL, HDL, LDLCALC, TRIG, CHOLHDL, LDLDIRECT in the last 72 hours. Thyroid function studies No results for input(s): TSH, T4TOTAL, T3FREE, THYROIDAB in the last 72 hours.  Invalid input(s): FREET3 Anemia work up No results for input(s): VITAMINB12, FOLATE, FERRITIN, TIBC, IRON, RETICCTPCT in the last 72 hours. Urinalysis    Component Value Date/Time   COLORURINE YELLOW 06/13/2018 1309   APPEARANCEUR HAZY (A) 06/13/2018 1309   LABSPEC 1.026 06/13/2018 1309   PHURINE 5.0 06/13/2018 1309   GLUCOSEU NEGATIVE 06/13/2018 1309   HGBUR SMALL (A) 06/13/2018 1309   BILIRUBINUR NEGATIVE 06/13/2018 1309   KETONESUR 20 (A) 06/13/2018 1309   PROTEINUR 30 (A) 06/13/2018 1309   NITRITE NEGATIVE 06/13/2018 1309   LEUKOCYTESUR NEGATIVE 06/13/2018 1309   Sepsis Labs Invalid input(s): PROCALCITONIN,  WBC,  LACTICIDVEN Microbiology Recent Results (from the past 240 hour(s))  Blood culture (routine x 2)     Status: None (Preliminary result)   Collection Time: 06/13/18  1:20 PM  Result Value Ref Range Status   Specimen Description RIGHT ANTECUBITAL  Final   Special Requests   Final    BOTTLES DRAWN AEROBIC AND ANAEROBIC Blood Culture adequate volume   Culture   Final    NO GROWTH 3 DAYS Performed at [facility], [address] [place], [place] [address]    Report Status PENDING  Incomplete  Blood culture (routine x 2)     Status: None (Preliminary result)   Collection Time: 06/13/18  1:45 PM  Result Value Ref Range Status   Specimen Description LEFT ANTECUBITAL  Final   Special Requests   Final    BOTTLES DRAWN AEROBIC AND ANAEROBIC Blood Culture adequate volume   Culture   Final    NO GROWTH 3 DAYS Performed at [facility], [address], [place], [place] [address]    Report Status PENDING  Incomplete  MRSA PCR Screening     Status: None   Collection Time:  06/13/18  9:37 PM  Result Value Ref Range Status   MRSA by PCR NEGATIVE NEGATIVE Final    Comment:        The GeneXpert MRSA Assay (FDA approved for NASAL specimens only), is one component of a comprehensive MRSA colonization surveillance program. It is not intended to diagnose MRSA infection nor to guide or monitor treatment for MRSA infections. Performed at [facility], [address], [place], [place] [address]      Time coordinating discharge: 35 minutes  SIGNED:   Erick Blinks, DO [facility] 06/16/2018, 9:36 AM  If 7PM-7AM, please contact night-coverage [email or web address] Password [email or web address]

## 2018-06-16 NOTE — Progress Notes (Signed)
Mr. Seelbach walked about 150 feet with out oxygen. Saturations remained above 95% and heart rate stayed at 92 beats per minute.

## 2018-06-18 LAB — CULTURE, BLOOD (ROUTINE X 2)
Culture: NO GROWTH
Culture: NO GROWTH
Special Requests: ADEQUATE
Special Requests: ADEQUATE

## 2018-06-19 ENCOUNTER — Emergency Department (HOSPITAL_COMMUNITY): Payer: Medicare Other

## 2018-06-19 ENCOUNTER — Encounter (HOSPITAL_COMMUNITY): Payer: Self-pay | Admitting: Emergency Medicine

## 2018-06-19 ENCOUNTER — Inpatient Hospital Stay (HOSPITAL_COMMUNITY)
Admission: EM | Admit: 2018-06-19 | Discharge: 2018-06-25 | DRG: 291 | Disposition: A | Discharge status: Home / Self Care | Payer: Medicare Other | Attending: Family Medicine | Admitting: Family Medicine

## 2018-06-19 ENCOUNTER — Other Ambulatory Visit: Payer: Self-pay

## 2018-06-19 DIAGNOSIS — J810 Acute pulmonary edema: Secondary | ICD-10-CM | POA: Diagnosis not present

## 2018-06-19 DIAGNOSIS — I5033 Acute on chronic diastolic (congestive) heart failure: Secondary | ICD-10-CM | POA: Diagnosis present

## 2018-06-19 DIAGNOSIS — R778 Other specified abnormalities of plasma proteins: Secondary | ICD-10-CM | POA: Diagnosis present

## 2018-06-19 DIAGNOSIS — Z955 Presence of coronary angioplasty implant and graft: Secondary | ICD-10-CM

## 2018-06-19 DIAGNOSIS — E876 Hypokalemia: Secondary | ICD-10-CM | POA: Diagnosis present

## 2018-06-19 DIAGNOSIS — Z515 Encounter for palliative care: Secondary | ICD-10-CM

## 2018-06-19 DIAGNOSIS — I2510 Atherosclerotic heart disease of native coronary artery without angina pectoris: Secondary | ICD-10-CM | POA: Diagnosis not present

## 2018-06-19 DIAGNOSIS — J9601 Acute respiratory failure with hypoxia: Secondary | ICD-10-CM | POA: Diagnosis present

## 2018-06-19 DIAGNOSIS — K761 Chronic passive congestion of liver: Secondary | ICD-10-CM | POA: Diagnosis present

## 2018-06-19 DIAGNOSIS — F419 Anxiety disorder, unspecified: Secondary | ICD-10-CM | POA: Diagnosis present

## 2018-06-19 DIAGNOSIS — I110 Hypertensive heart disease with heart failure: Principal | ICD-10-CM | POA: Diagnosis present

## 2018-06-19 DIAGNOSIS — Z79899 Other long term (current) drug therapy: Secondary | ICD-10-CM

## 2018-06-19 DIAGNOSIS — I5022 Chronic systolic (congestive) heart failure: Secondary | ICD-10-CM | POA: Diagnosis present

## 2018-06-19 DIAGNOSIS — I4891 Unspecified atrial fibrillation: Secondary | ICD-10-CM

## 2018-06-19 DIAGNOSIS — I482 Chronic atrial fibrillation, unspecified: Secondary | ICD-10-CM | POA: Diagnosis present

## 2018-06-19 DIAGNOSIS — I248 Other forms of acute ischemic heart disease: Secondary | ICD-10-CM | POA: Diagnosis present

## 2018-06-19 DIAGNOSIS — Z95810 Presence of automatic (implantable) cardiac defibrillator: Secondary | ICD-10-CM

## 2018-06-19 DIAGNOSIS — E039 Hypothyroidism, unspecified: Secondary | ICD-10-CM | POA: Diagnosis present

## 2018-06-19 DIAGNOSIS — R7989 Other specified abnormal findings of blood chemistry: Secondary | ICD-10-CM | POA: Diagnosis not present

## 2018-06-19 DIAGNOSIS — I252 Old myocardial infarction: Secondary | ICD-10-CM

## 2018-06-19 DIAGNOSIS — D649 Anemia, unspecified: Secondary | ICD-10-CM | POA: Diagnosis present

## 2018-06-19 DIAGNOSIS — I480 Paroxysmal atrial fibrillation: Secondary | ICD-10-CM | POA: Diagnosis present

## 2018-06-19 DIAGNOSIS — S70219A Abrasion, unspecified hip, initial encounter: Secondary | ICD-10-CM

## 2018-06-19 DIAGNOSIS — J441 Chronic obstructive pulmonary disease with (acute) exacerbation: Secondary | ICD-10-CM | POA: Diagnosis present

## 2018-06-19 DIAGNOSIS — Z79890 Hormone replacement therapy (postmenopausal): Secondary | ICD-10-CM

## 2018-06-19 DIAGNOSIS — I272 Pulmonary hypertension, unspecified: Secondary | ICD-10-CM | POA: Diagnosis present

## 2018-06-19 DIAGNOSIS — Z8249 Family history of ischemic heart disease and other diseases of the circulatory system: Secondary | ICD-10-CM

## 2018-06-19 DIAGNOSIS — Z7901 Long term (current) use of anticoagulants: Secondary | ICD-10-CM

## 2018-06-19 DIAGNOSIS — Z681 Body mass index (BMI) 19 or less, adult: Secondary | ICD-10-CM

## 2018-06-19 DIAGNOSIS — Z7189 Other specified counseling: Secondary | ICD-10-CM

## 2018-06-19 DIAGNOSIS — F329 Major depressive disorder, single episode, unspecified: Secondary | ICD-10-CM | POA: Diagnosis present

## 2018-06-19 DIAGNOSIS — E440 Moderate protein-calorie malnutrition: Secondary | ICD-10-CM | POA: Diagnosis present

## 2018-06-19 DIAGNOSIS — I959 Hypotension, unspecified: Secondary | ICD-10-CM | POA: Diagnosis present

## 2018-06-19 DIAGNOSIS — E785 Hyperlipidemia, unspecified: Secondary | ICD-10-CM | POA: Diagnosis present

## 2018-06-19 DIAGNOSIS — E119 Type 2 diabetes mellitus without complications: Secondary | ICD-10-CM | POA: Diagnosis present

## 2018-06-19 DIAGNOSIS — Z87891 Personal history of nicotine dependence: Secondary | ICD-10-CM

## 2018-06-19 HISTORY — DX: Unspecified atrial fibrillation: I48.91

## 2018-06-19 HISTORY — DX: Atherosclerotic heart disease of native coronary artery without angina pectoris: I25.10

## 2018-06-19 HISTORY — DX: Presence of automatic (implantable) cardiac defibrillator: Z95.810

## 2018-06-19 LAB — CBC WITH DIFFERENTIAL/PLATELET
Abs Immature Granulocytes: 0.07 10*3/uL (ref 0.00–0.07)
BASOS PCT: 0 %
Basophils Absolute: 0 10*3/uL (ref 0.0–0.1)
Eosinophils Absolute: 0 10*3/uL (ref 0.0–0.5)
Eosinophils Relative: 0 %
HCT: 29.3 % — ABNORMAL LOW (ref 39.0–52.0)
Hemoglobin: 9.5 g/dL — ABNORMAL LOW (ref 13.0–17.0)
Immature Granulocytes: 1 %
Lymphocytes Relative: 7 %
Lymphs Abs: 0.9 10*3/uL (ref 0.7–4.0)
MCH: 31.8 pg (ref 26.0–34.0)
MCHC: 32.4 g/dL (ref 30.0–36.0)
MCV: 98 fL (ref 80.0–100.0)
Monocytes Absolute: 0.7 10*3/uL (ref 0.1–1.0)
Monocytes Relative: 5 %
NEUTROS PCT: 87 %
Neutro Abs: 10.7 10*3/uL — ABNORMAL HIGH (ref 1.7–7.7)
Platelets: 214 10*3/uL (ref 150–400)
RBC: 2.99 MIL/uL — ABNORMAL LOW (ref 4.22–5.81)
RDW: 14 % (ref 11.5–15.5)
WBC: 12.4 10*3/uL — ABNORMAL HIGH (ref 4.0–10.5)
nRBC: 0.2 % (ref 0.0–0.2)

## 2018-06-19 LAB — COMPREHENSIVE METABOLIC PANEL
ALT: 41 U/L (ref 0–44)
AST: 42 U/L — ABNORMAL HIGH (ref 15–41)
Albumin: 3.1 g/dL — ABNORMAL LOW (ref 3.5–5.0)
Alkaline Phosphatase: 78 U/L (ref 38–126)
Anion gap: 11 (ref 5–15)
BUN: 27 mg/dL — AB (ref 8–23)
CO2: 19 mmol/L — ABNORMAL LOW (ref 22–32)
Calcium: 8 mg/dL — ABNORMAL LOW (ref 8.9–10.3)
Chloride: 111 mmol/L (ref 98–111)
Creatinine, Ser: 0.91 mg/dL (ref 0.61–1.24)
GFR calc Af Amer: >60 mL/min (ref >60)
GFR calc non Af Amer: >60 mL/min (ref >60)
Glucose, Bld: 115 mg/dL — ABNORMAL HIGH (ref 70–99)
Potassium: 3.1 mmol/L — ABNORMAL LOW (ref 3.5–5.1)
Sodium: 141 mmol/L (ref 135–145)
Total Bilirubin: 2 mg/dL — ABNORMAL HIGH (ref 0.3–1.2)
Total Protein: 5.9 g/dL — ABNORMAL LOW (ref 6.5–8.1)

## 2018-06-19 LAB — LACTIC ACID, PLASMA: Lactic Acid, Venous: 1.9 mmol/L (ref 0.5–1.9)

## 2018-06-19 LAB — TROPONIN I: Troponin I: 0.05 ng/mL (ref <0.03)

## 2018-06-19 LAB — BRAIN NATRIURETIC PEPTIDE: B Natriuretic Peptide: 1319 pg/mL — ABNORMAL HIGH (ref 0.0–100.0)

## 2018-06-19 MED ORDER — APIXABAN 5 MG PO TABS
5.0000 mg | ORAL_TABLET | Freq: Two times a day (BID) | ORAL | Status: DC
  Administered 2018-06-20 – 2018-06-25 (×11): 5 mg via ORAL
  Filled 2018-06-19 (×12): qty 1

## 2018-06-19 MED ORDER — MAGNESIUM SULFATE IN D5W 1-5 GM/100ML-% IV SOLN: 1.0000 g | Freq: Once | INTRAVENOUS | Status: DC

## 2018-06-19 MED ORDER — ATORVASTATIN CALCIUM 20 MG PO TABS
20.0000 mg | ORAL_TABLET | Freq: Every day | ORAL | Status: DC
  Administered 2018-06-20 – 2018-06-24 (×5): 20 mg via ORAL
  Filled 2018-06-19 (×6): qty 1

## 2018-06-19 MED ORDER — MAGNESIUM SULFATE 2 GM/50ML IV SOLN
INTRAVENOUS | Status: AC
  Administered 2018-06-19: 1 g
  Filled 2018-06-19: qty 50

## 2018-06-19 MED ORDER — DM-GUAIFENESIN ER 30-600 MG PO TB12
1.0000 | ORAL_TABLET | Freq: Two times a day (BID) | ORAL | Status: DC
  Administered 2018-06-20 – 2018-06-25 (×10): 1 via ORAL
  Filled 2018-06-19 (×12): qty 1

## 2018-06-19 MED ORDER — SODIUM CHLORIDE 0.9 % IV SOLN: 250.0000 mL | INTRAVENOUS | Status: DC | PRN

## 2018-06-19 MED ORDER — POTASSIUM CHLORIDE CRYS ER 20 MEQ PO TBCR: 40.0000 meq | EXTENDED_RELEASE_TABLET | Freq: Once | ORAL | Status: DC

## 2018-06-19 MED ORDER — POTASSIUM CHLORIDE CRYS ER 20 MEQ PO TBCR
40.0000 meq | EXTENDED_RELEASE_TABLET | Freq: Once | ORAL | Status: AC
  Administered 2018-06-19: 40 meq via ORAL
  Filled 2018-06-19: qty 2

## 2018-06-19 MED ORDER — POTASSIUM CHLORIDE CRYS ER 20 MEQ PO TBCR
20.0000 meq | EXTENDED_RELEASE_TABLET | Freq: Two times a day (BID) | ORAL | Status: DC
  Administered 2018-06-20 – 2018-06-25 (×10): 20 meq via ORAL
  Filled 2018-06-19 (×11): qty 1

## 2018-06-19 MED ORDER — ACETAMINOPHEN 325 MG PO TABS
650.0000 mg | ORAL_TABLET | ORAL | Status: DC | PRN
  Administered 2018-06-22: 650 mg via ORAL
  Filled 2018-06-19: qty 2

## 2018-06-19 MED ORDER — SODIUM CHLORIDE 0.9% FLUSH: 3.0000 mL | INTRAVENOUS | Status: DC | PRN

## 2018-06-19 MED ORDER — POTASSIUM CHLORIDE 10 MEQ/100ML IV SOLN: 10.0000 meq | Freq: Once | INTRAVENOUS | Status: DC

## 2018-06-19 MED ORDER — IPRATROPIUM-ALBUTEROL 0.5-2.5 (3) MG/3ML IN SOLN
3.0000 mL | Freq: Four times a day (QID) | RESPIRATORY_TRACT | Status: DC | PRN
  Administered 2018-06-22: 3 mL via RESPIRATORY_TRACT
  Filled 2018-06-19: qty 3

## 2018-06-19 MED ORDER — AMIODARONE HCL 200 MG PO TABS
100.0000 mg | ORAL_TABLET | Freq: Every day | ORAL | Status: DC
  Administered 2018-06-20 – 2018-06-21 (×2): 100 mg via ORAL
  Filled 2018-06-19 (×2): qty 1

## 2018-06-19 MED ORDER — DILTIAZEM HCL 100 MG IV SOLR
5.0000 mg/h | INTRAVENOUS | Status: DC
  Administered 2018-06-19 – 2018-06-20 (×3): 5 mg/h via INTRAVENOUS
  Filled 2018-06-19 (×3): qty 100

## 2018-06-19 MED ORDER — DILTIAZEM LOAD VIA INFUSION
20.0000 mg | Freq: Once | INTRAVENOUS | Status: AC
  Administered 2018-06-19: 20 mg via INTRAVENOUS
  Filled 2018-06-19: qty 20

## 2018-06-19 MED ORDER — LEVOTHYROXINE SODIUM 112 MCG PO TABS
112.0000 ug | ORAL_TABLET | Freq: Every day | ORAL | Status: DC
  Administered 2018-06-20 – 2018-06-25 (×5): 112 ug via ORAL
  Filled 2018-06-19 (×6): qty 1

## 2018-06-19 MED ORDER — TRAZODONE HCL 50 MG PO TABS: 200.0000 mg | ORAL_TABLET | Freq: Every day | ORAL | Status: DC

## 2018-06-19 MED ORDER — SODIUM CHLORIDE 0.9% FLUSH
3.0000 mL | Freq: Two times a day (BID) | INTRAVENOUS | Status: DC
  Administered 2018-06-20 – 2018-06-25 (×9): 3 mL via INTRAVENOUS

## 2018-06-19 MED ORDER — ZOLPIDEM TARTRATE 5 MG PO TABS
5.0000 mg | ORAL_TABLET | Freq: Every evening | ORAL | Status: DC | PRN
  Administered 2018-06-20 (×2): 5 mg via ORAL
  Filled 2018-06-19 (×3): qty 1

## 2018-06-19 MED ORDER — ONDANSETRON HCL 4 MG/2ML IJ SOLN: 4.0000 mg | Freq: Four times a day (QID) | INTRAMUSCULAR | Status: DC | PRN

## 2018-06-19 MED ORDER — FUROSEMIDE 10 MG/ML IJ SOLN
20.0000 mg | Freq: Two times a day (BID) | INTRAMUSCULAR | Status: DC
  Administered 2018-06-20 – 2018-06-24 (×11): 20 mg via INTRAVENOUS
  Filled 2018-06-19 (×11): qty 2

## 2018-06-19 NOTE — H&P (Signed)
History and Physical    Anthony Charles OMV:[id number] DOB: 1945-05-25 DOA: 06/19/2018  [facility]   Patient coming from: Home   Chief Complaint: SOB   HPI: Anthony Charles is a 73 y.o. male with medical history significant for atrial fibrillation on Eliquis, chronic diastolic CHF, hypothyroidism, and coronary artery disease, now presenting to the emergency department for evaluation of shortness of breath.  Patient was admitted to the hospital on 06/13/2018, discharged 3 days later after treatment for influenza A with acute hypoxic respiratory failure.  Patient's symptoms had nearly resolved at time of hospital discharge and he was saturating adequately on room air at that time.  Unfortunately, since returning home, he has developed a recurrence and shortness of breath.  Patient reports improvement in his cough since the recent hospitalization, denies any fevers or chills since discharge, denies any chest pain, but reports worsening bilateral lower extremity swelling and has also developed orthopnea over the past couple days, stating that he has been unable to sleep since leaving the hospital due to orthopnea and shortness of breath.  ED Course: Upon arrival to the ED, patient is found to be afebrile, saturating low 90s on room air, mildly tachypneic, tachycardic in the 140s, and with blood pressure 85/73.  EKG features atrial fibrillation with RVR, rate 148, and repolarization abnormality.  Chest x-ray is notable for mild pulmonary edema.  Chemistry panel features a potassium of 3.1, AST 42, and bilirubin 2.0.  CBC features a new leukocytosis to 12,400 and a stable normocytic anemia with hemoglobin 9.5.  Lactic acid is reassuringly normal, troponin is slightly elevated at 0.05, and BNP is elevated to 1320.  Patient was given 20 mg IV diltiazem and started on diltiazem infusion in the ED. Heart rate has improved, SBP is in the 90s, and the patient will be sent to the stepdown unit for  ongoing evaluation and management.  Review of Systems:  All other systems reviewed and apart from HPI, are negative.  Past Medical History:  Diagnosis Date   Agent orange exposure    Aneurysm (HCC)    Atrial fibrillation (HCC)    Diabetes mellitus without complication (HCC)    Hernia of abdominal wall    Thyroid disease     Past Surgical History:  Procedure Laterality Date   pacemaker/defibrillator       reports that he has quit smoking. He has never used smokeless tobacco. He reports that he does not drink alcohol or use drugs.  No Known Allergies  History reviewed. No pertinent family history.   Prior to Admission medications   Medication Sig Start Date End Date Taking? Authorizing Provider  amiodarone (PACERONE) 200 MG tablet Take 100 mg by mouth daily.   Yes [provider]  apixaban (ELIQUIS) 5 MG TABS tablet Take 5 mg by mouth 2 (two) times daily.   Yes [provider]  atorvastatin (LIPITOR) 20 MG tablet Take 20 mg by mouth at bedtime.   Yes [provider]  cetirizine (ZYRTEC) 10 MG tablet Take 5 mg by mouth daily.   Yes [provider]  ibuprofen (ADVIL,MOTRIN) 600 MG tablet Take 600 mg by mouth every 6 (six) hours as needed for mild pain or moderate pain.   Yes [provider]  ipratropium-albuterol (DUONEB) 0.5-2.5 (3) MG/3ML SOLN Take 3 mLs by nebulization every 6 (six) hours as needed (dyspnea or wheezing). 06/16/18  Yes Shah, Pratik D, DO  levothyroxine (SYNTHROID, LEVOTHROID) 112 MCG tablet Take 112 mcg by mouth every  morning.   Yes [provider]  metoprolol succinate (TOPROL-XL) 100 MG 24 hr tablet Take 100 mg by mouth daily. Take with or immediately following a meal.    Yes [provider]  Multiple Vitamin (MULTIVITAMIN WITH MINERALS) TABS tablet Take 1 tablet by mouth daily.   Yes [provider]  promethazine (PHENERGAN) 25 MG tablet Take 25 mg by mouth every 6 (six) hours as  needed for nausea or vomiting.   Yes [provider]  traZODone (DESYREL) 100 MG tablet Take 200 mg by mouth at bedtime.   Yes [provider]  dextromethorphan-guaiFENesin (MUCINEX DM) 30-600 MG 12hr tablet Take 1 tablet by mouth 2 (two) times daily for 10 days. Patient not taking: Reported on 06/19/2018 06/16/18 06/26/18  Maurilio Lovely D, DO  feeding supplement, ENSURE ENLIVE, (ENSURE ENLIVE) LIQD Take 237 mLs by mouth 2 (two) times daily between meals. Patient not taking: Reported on 06/19/2018 06/16/18   Sherryll Burger, Pratik D, DO  Ipratropium-Albuterol (COMBIVENT RESPIMAT) 20-100 MCG/ACT AERS respimat Inhale 1 puff into the lungs every 6 (six) hours as needed for wheezing or shortness of breath. Patient not taking: Reported on 06/19/2018 06/16/18   Maurilio Lovely D, DO  oseltamivir (TAMIFLU) 75 MG capsule Take 1 capsule (75 mg total) by mouth every 12 (twelve) hours. Patient not taking: Reported on 06/19/2018 06/13/18   Raeford Razor, MD  predniSONE (DELTASONE) 20 MG tablet Take 2 tablets (40 mg total) by mouth daily for 5 days. Patient not taking: Reported on 06/19/2018 06/16/18 06/21/18  Maurilio Lovely D, DO    Physical Exam: Vitals:   06/19/18 2050 06/19/18 2110 06/19/18 2120 06/19/18 2121  BP: (!) 143/119 100/78 92/70   Pulse: (!) 59 (!) 129 (!) 121 (!) 121  Resp: 20 (!) Temp:      TempSrc:      SpO2: 95% 96% 96% 96%  Weight:      Height:        Constitutional: NAD, frail-appearing Eyes: PERTLA, lids and conjunctivae normal ENMT: Mucous membranes are moist. Posterior pharynx clear of any exudate or lesions.   Neck: normal, supple, no masses, no thyromegaly Respiratory: Mild tachypnea. Speaking full sentences. Rales bilaterally. No wheezing, no rhonchi. No accessory muscle use.  Cardiovascular: Rate ~120 and irregular. 2+ pretibial pitting edema. Abdomen: No distension, soft, nontender. Bowel sounds active.  Musculoskeletal: no clubbing / cyanosis. No joint deformity  upper and lower extremities.   Skin: no significant rashes, lesions, ulcers. Warm, dry, well-perfused. Neurologic: CN 2-12 grossly intact. Sensation intact. Strength 5/5 in all 4 limbs.  Psychiatric: Alert and oriented to person, place, and situation. Pleasant and cooperative.    Labs on Admission: I have personally reviewed following labs and imaging studies  CBC: Recent Labs  Lab 06/13/18 1335 06/14/18 0524 06/15/18 0413 06/19/18 1932  WBC 8.4 6.3 6.0 12.4*  NEUTROABS 7.3  --   --  10.7*  HGB 12.1* 9.8* 9.5* 9.5*  HCT 34.5* 32.0* 29.9* 29.3*  MCV 100.0 105.6* 101.7* 98.0  PLT 189 122* 134* 214   Basic Metabolic Panel: Recent Labs  Lab 06/13/18 1335 06/14/18 0524 06/15/18 0413 06/19/18 1932  NA 139 142 143 141  K 3.2* 3.7 4.2 3.1*  CL 107 117* 119* 111  CO2 23 17* 19* 19*  GLUCOSE 116* 136* 128* 115*  BUN 15 20 25* 27*  CREATININE 1.21 1.21 1.02 0.91  CALCIUM 8.7* 7.1* 7.5* 8.0*   GFR: Estimated Creatinine Clearance: 63.5 mL/min (by  C-G formula based on SCr of 0.91 mg/dL). Liver Function Tests: Recent Labs  Lab 06/15/18 0413 06/19/18 1932  AST 57* 42*  ALT 26 41  ALKPHOS 76 78  BILITOT 0.6 2.0*  PROT 5.2* 5.9*  ALBUMIN 2.7* 3.1*   No results for input(s): LIPASE, AMYLASE in the last 168 hours. No results for input(s): AMMONIA in the last 168 hours. Coagulation Profile: Recent Labs  Lab 06/13/18 2149  INR 1.4*   Cardiac Enzymes: Recent Labs  Lab 06/19/18 1932  TROPONINI 0.05*   BNP (last 3 results) No results for input(s): PROBNP in the last 8760 hours. HbA1C: No results for input(s): HGBA1C in the last 72 hours. CBG: No results for input(s): GLUCAP in the last 168 hours. Lipid Profile: No results for input(s): CHOL, HDL, LDLCALC, TRIG, CHOLHDL, LDLDIRECT in the last 72 hours. Thyroid Function Tests: No results for input(s): TSH, T4TOTAL, FREET4, T3FREE, THYROIDAB in the last 72 hours. Anemia Panel: No results for input(s): VITAMINB12,  FOLATE, FERRITIN, TIBC, IRON, RETICCTPCT in the last 72 hours. Urine analysis:    Component Value Date/Time   COLORURINE YELLOW 06/13/2018 1309   APPEARANCEUR HAZY (A) 06/13/2018 1309   LABSPEC 1.026 06/13/2018 1309   PHURINE 5.0 06/13/2018 1309   GLUCOSEU NEGATIVE 06/13/2018 1309   HGBUR SMALL (A) 06/13/2018 1309   BILIRUBINUR NEGATIVE 06/13/2018 1309   KETONESUR 20 (A) 06/13/2018 1309   PROTEINUR 30 (A) 06/13/2018 1309   NITRITE NEGATIVE 06/13/2018 1309   LEUKOCYTESUR NEGATIVE 06/13/2018 1309   Sepsis Labs: @LABRCNTIP (procalcitonin:4,lacticidven:4) ) Recent Results (from the past 240 hour(s))  Blood culture (routine x 2)     Status: None   Collection Time: 06/13/18  1:20 PM  Result Value Ref Range Status   Specimen Description RIGHT ANTECUBITAL  Final   Special Requests   Final    BOTTLES DRAWN AEROBIC AND ANAEROBIC Blood Culture adequate volume   Culture   Final    NO GROWTH 5 DAYS Performed at [facility], [address] [place], [place] [address]    Report Status 06/18/2018 FINAL  Final  Blood culture (routine x 2)     Status: None   Collection Time: 06/13/18  1:45 PM  Result Value Ref Range Status   Specimen Description LEFT ANTECUBITAL  Final   Special Requests   Final    BOTTLES DRAWN AEROBIC AND ANAEROBIC Blood Culture adequate volume   Culture   Final    NO GROWTH 5 DAYS Performed at [facility], [address] [place], [place] [address]    Report Status 06/18/2018 FINAL  Final  MRSA PCR Screening     Status: None   Collection Time: 06/13/18  9:37 PM  Result Value Ref Range Status   MRSA by PCR NEGATIVE NEGATIVE Final    Comment:        The GeneXpert MRSA Assay (FDA approved for NASAL specimens only), is one component of a comprehensive MRSA colonization surveillance program. It is not intended to diagnose MRSA infection nor to guide or monitor treatment for MRSA infections. Performed at [facility], [address] [place], [place] [address]    Blood culture (routine x 2)     Status: None (Preliminary result)   Collection Time: 06/19/18  7:32 PM  Result Value Ref Range Status   Specimen Description BLOOD RIGHT FOREARM  Final   Special Requests   Final    BOTTLES DRAWN AEROBIC AND ANAEROBIC Blood Culture adequate volume Performed at [facility], [address],  [place], [place] [address]    Culture PENDING  Incomplete   Report Status PENDING  Incomplete  Blood culture (routine x 2)     Status: None (Preliminary result)   Collection Time: 06/19/18  7:37 PM  Result Value Ref Range Status   Specimen Description LEFT ANTECUBITAL  Final   Special Requests   Final    BOTTLES DRAWN AEROBIC AND ANAEROBIC Blood Culture adequate volume Performed at [facility], [address] [place], [place] [address]    Culture PENDING  Incomplete   Report Status PENDING  Incomplete     Radiological Exams on Admission: Dg Chest Port 1 View  Result Date: 06/19/2018 CLINICAL DATA:  Cough, shortness of breath and chest pain. EXAM: PORTABLE CHEST 1 VIEW COMPARISON:  06/13/2018 FINDINGS: Stable heart size and appearance of dual-chamber pacemaker. Since the prior study, there has been development of mild pulmonary edema. No pleural effusions or focal airspace consolidation. No pneumothorax. Stable chronic lung disease. IMPRESSION: Development of mild pulmonary edema. Electronically Signed   By: Irish Lack M.D.   On: 06/19/2018 20:30    EKG: Independently reviewed. Atrial fibrillation with RVR (rate 148), repolarization abnormality.   Assessment/Plan  1. Atrial fibrillation with RVR  - Presents with SOB and orthopnea, found to be in atrial fibrillation with rate 140's - He has chronic a fib on Eliquis and amiodarone  - Given 20 mg IV bolus of diltiazem in ED and started on diltiazem infusion  - CHADS-VASc is at least 53 (age x2, CAD, CHF)  - Continue diltiazem infusion with titration, continue Eliquis and amiodarone, check TSH, replace potassium  to 4 and mag to 2    2. Acute on chronic diastolic CHF  - Presents with SOB, orthopnea, and leg swelling  - Found to have mild pulmonary edema on CXR, peripheral edema and rales on exam, and BNP of 1320  - A fib with rapid rate could be contributing, as well as recent IVF in hospital (which was appropriate in setting of sepsis with lactate >4 and tolerated well at that time)  - Diurese with Lasix 20 mg IV q12h as BP allows, follow I/Os and daily wt and adjust diuretic as needed  - Continue cardiac monitoring and follow renal function and electrolytes during diuresis    3. CAD; elevated troponin  - Denies chest pain on admission  - Troponin is 0.05 in ED, EKG with AF RVR and repolarization abnormality that is likely rate-related; CXR with pulm edema  - Continue cardiac monitoring, trend troponin, continue statin, hold beta-blocker until BP improves   4. Hypokalemia  - Serum potassium is 3.1 on admission  - Treated with 40 mEq oral potassium on admission  - Continue replacement with 20 mEq BID during diuresis, repeat chem panel in am    5. Normocytic anemia  - Hgb is stable on admission  - Pt denies melena or hematochezia   6. Hypothyroidism  - Check TSH given rapid a fib  - Continue Synthroid   7. Hyperbilirubinemia  - Total bili is 2.0 on admission, having been wnl a few days earlier  - AST slightly elevated, lower than a few days ago  - Abd exam is benign  - Fractionate, trend    DVT prophylaxis: Eliquis  Code Status: Full  Family Communication: Discussed with patient  Consults called: None Admission status: Observation     Briscoe Deutscher, MD [facility] Pager [phone]  If 7PM-7AM, please contact night-coverage [email or web address] Password [facility]  06/19/2018, 9:37  PM

## 2018-06-19 NOTE — ED Notes (Signed)
Port Cxray complete

## 2018-06-19 NOTE — ED Provider Notes (Signed)
[facility] EMERGENCY DEPARTMENT Provider Note   CSN: [id number] Arrival date & time: 06/19/18  1913    History   Chief Complaint Chief Complaint  Patient presents with  . Shortness of Breath    HPI Anthony Charles is a 72 y.o. male.     HPI  61 -male, he is a known history of Agent Orange exposure, history of diabetes, history of recent admission to the hospital for influenza A, he is known to have chronic diastolic congestive heart failure with chronic atrial fibrillation and has a biventricular ICD pacer.  The patient had been admitted to the hospital on March 11, was diagnosed with the flu, he also has a known history of coronary disease with stenting in 2016 in the state of [place] where he still goes to get his cardiac care.  He is on Eliquis and amiodarone.  He had developed acute hypoxemic respiratory failure secondary to the flu and was admitted for approximately 4 days given IV steroids pill Pulmicort and Tamiflu and on discharge it was reported that his symptoms had almost completely resolved and he was no longer hypoxemic.  The patient has been home for the last several days and reports that he is now began more shortness of breath, he is more dyspneic, he is more swollen in his legs, he is feeling more weak and seems to be gradually worsening.  He is accompanied by his caregiver who reports that he has been looking very poorly today and brought him back because of his increased work of breathing.  He reports that the albuterol metered-dose inhaler that he has at home is not working like the nebulized treatments do and he does not have a nebulizer.  He generally goes to the [place] [facility] in [facility]  Past Medical History:  Diagnosis Date  . Agent orange exposure   . Aneurysm (HCC)   . Diabetes mellitus without complication (HCC)   . Hernia of abdominal wall   . Thyroid disease     Patient Active Problem List   Diagnosis Date Noted  . Malnutrition of moderate  degree 06/16/2018  . Sepsis (HCC) 06/13/2018  . Influenza A 06/13/2018  . Acute respiratory failure with hypoxia (HCC) 06/13/2018  . CAD in native artery 06/13/2018  . Hx of myocardial infarction 06/13/2018  . H/O agent Orange exposure 06/13/2018  . Protein calorie malnutrition (HCC) 06/13/2018  . Generalized weakness 06/13/2018  . Hypokalemia 06/13/2018  . Chronic diastolic CHF (congestive heart failure) (HCC) 06/13/2018  . Atrial fibrillation, chronic 06/13/2018  . 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 pacemaker/defibrillator          Home Medications    Prior to Admission medications   Medication Sig Start Date End Date Taking? Authorizing Provider  amiodarone (PACERONE) 200 MG tablet Take 100 mg by mouth daily.    [provider]  apixaban (ELIQUIS) 5 MG TABS tablet Take 5 mg by mouth 2 (two) times daily.    [provider]  atorvastatin (LIPITOR) 20 MG tablet Take 20 mg by mouth at bedtime.    [provider]  cetirizine (ZYRTEC) 10 MG tablet Take 5 mg by mouth daily.    [provider]  clopidogrel (PLAVIX) 75 MG tablet Take 75 mg by mouth every morning.    [provider]  dextromethorphan-guaiFENesin (MUCINEX DM) 30-600 MG 12hr tablet Take 1 tablet by mouth 2 (two) times daily  for 10 days. 06/16/18 06/26/18  Sherryll Burger, Pratik D, DO  feeding supplement, ENSURE ENLIVE, (ENSURE ENLIVE) LIQD Take 237 mLs by mouth 2 (two) times daily between meals. 06/16/18   Sherryll Burger, Pratik D, DO  ibuprofen (ADVIL,MOTRIN) 600 MG tablet Take 600 mg by mouth every 6 (six) hours as needed for mild pain or moderate pain.    [provider]  Ipratropium-Albuterol (COMBIVENT RESPIMAT) 20-100 MCG/ACT AERS respimat Inhale 1 puff into the lungs every 6 (six) hours as needed for wheezing or shortness of breath. 06/16/18   Sherryll Burger, Pratik D, DO   ipratropium-albuterol (DUONEB) 0.5-2.5 (3) MG/3ML SOLN Take 3 mLs by nebulization every 6 (six) hours as needed (dyspnea or wheezing). 06/16/18   Sherryll Burger, Pratik D, DO  levothyroxine (SYNTHROID, LEVOTHROID) 112 MCG tablet Take 112 mcg by mouth every morning.    [provider]  metoprolol succinate (TOPROL-XL) 100 MG 24 hr tablet Take 50 mg by mouth daily. Take with or immediately following a meal.    [provider]  Multiple Vitamin (MULTIVITAMIN WITH MINERALS) TABS tablet Take 1 tablet by mouth daily.    [provider]  oseltamivir (TAMIFLU) 75 MG capsule Take 1 capsule (75 mg total) by mouth every 12 (twelve) hours. 06/13/18   Raeford Razor, MD  predniSONE (DELTASONE) 20 MG tablet Take 2 tablets (40 mg total) by mouth daily for 5 days. 06/16/18 06/21/18  Sherryll Burger, Pratik D, DO  promethazine (PHENERGAN) 25 MG tablet Take 25 mg by mouth every 6 (six) hours as needed for nausea or vomiting.    [provider]  traZODone (DESYREL) 100 MG tablet Take 200 mg by mouth at bedtime.    [provider]    Family History History reviewed. No pertinent family history.  Social History Social History   Tobacco Use  . Smoking status: Former Games developer  . Smokeless tobacco: Never Used  Substance Use Topics  . Alcohol use: Never    Frequency: Never  . Drug use: Never     Allergies   Patient has no known allergies.   Review of Systems Review of Systems  All other systems reviewed and are negative.    Physical Exam Updated Vital Signs BP 116/82 (BP Location: Left Arm)   Pulse (!) 136   Temp 98 F (36.7 C) (Oral)   Resp 20   Ht 1.778 m ( )   Wt 64 kg   SpO2 94%   BMI 20.24 kg/m   Physical Exam Vitals signs and nursing note reviewed.  Constitutional:      Appearance: He is well-developed. He is ill-appearing.  HENT:     Head: Normocephalic and atraumatic.     Mouth/Throat:     Pharynx: No oropharyngeal exudate.     Comments: Pale mucous  membranes Eyes:     General: No scleral icterus.       Right eye: No discharge.        Left eye: No discharge.     Conjunctiva/sclera: Conjunctivae normal.     Pupils: Pupils are equal, round, and reactive to light.  Neck:     Musculoskeletal: Normal range of motion and neck supple.     Thyroid: No thyromegaly.     Vascular: No JVD.  Cardiovascular:     Rate and Rhythm: Tachycardia present. Rhythm irregular.     Heart sounds: Normal heart sounds. No murmur. No friction rub. No gallop.   Pulmonary:     Effort: Respiratory distress present.     Breath  sounds: Wheezing and rales present.  Abdominal:     General: Bowel sounds are normal. There is no distension.     Palpations: Abdomen is soft. There is no mass.     Tenderness: There is no abdominal tenderness.  Musculoskeletal: Normal range of motion.        General: No tenderness.     Right lower leg: Edema present.     Left lower leg: Edema present.  Lymphadenopathy:     Cervical: No cervical adenopathy.  Skin:    General: Skin is warm and dry.     Findings: No erythema or rash.     Comments: Ecchymosis on the bilateral arms as well as down over the lower right abdominal wall  Neurological:     Mental Status: He is alert.     Coordination: Coordination normal.  Psychiatric:        Behavior: Behavior normal.      ED Treatments / Results  Labs (all labs ordered are listed, but only abnormal results are displayed) Labs Reviewed - No data to display  EKG EKG Interpretation  Date/Time:  Tuesday June 19 2018 19:24:10 EDT Ventricular Rate:  148 PR Interval:    QRS Duration: 82 QT Interval:  279 QTC Calculation: 438 R Axis:   76 Text Interpretation:  Atrial fibrillation with rapid V-rate Consider anterior infarct Repolarization abnormality, prob rate related Since last tracing Atrial fibrillation with rvr is present, no pacer spikes seen Confirmed by Eber Hong ([id number]) on 06/19/2018 7:27:40 PM   Radiology No  results found.  Procedures .Critical Care Performed by: Eber Hong, MD Authorized by: Eber Hong, MD   Critical care provider statement:    Critical care time (minutes):  35   Critical care time was exclusive of:  Separately billable procedures and treating other patients and teaching time   Critical care was necessary to treat or prevent imminent or life-threatening deterioration of the following conditions:  Respiratory failure and cardiac failure   Critical care was time spent personally by me on the following activities:  Blood draw for specimens, development of treatment plan with patient or surrogate, discussions with consultants, evaluation of patient's response to treatment, examination of patient, obtaining history from patient or surrogate, ordering and performing treatments and interventions, ordering and review of laboratory studies, ordering and review of radiographic studies, pulse oximetry, re-evaluation of patient's condition and review of old charts   (including critical care time)  Medications Ordered in ED Medications - No data to display   Initial Impression / Assessment and Plan / ED Course  I have reviewed the triage vital signs and the nursing notes.  Pertinent labs & imaging results that were available during my care of the patient were reviewed by me and considered in my medical decision making (see chart for details).        The patient presents with multiple complaints however of note the patient is in atrial fibrillation with a rapid ventricular rate, he is in chronic atrial fibrillation and is anticoagulated on Eliquis and taking amiodarone.  His pulmonary exam is also very abnormal with the presence of both rales wheezing and tachypnea.  He has an oxygen saturation above 92% on room air at this time.  The patient is not known to use home oxygen.  He does have bilateral edema which could be consistent with congestive heart failure, he has ecchymosis on  his abdominal wall which I suspect is related to Lovenox injections while he was in the  hospital.  He has a nontender abdomen.  Will begin the work-up for causes of the patient's decompensated state including from an infectious and a cardiac and pulmonary standpoint.  Patient is agreeable to the plan, he will need rate control with Cardizem bolus and a drip as he has a severe tachycardia that is making him critically ill.  The patient is started to improve with the Cardizem, I have discussed his case with Dr. Antionette Char who will admit the patient to the hospital.  The patient is critically ill  Final Clinical Impressions(s) / ED Diagnoses   Final diagnoses:  Acute pulmonary edema (HCC)  Atrial fibrillation with rapid ventricular response (HCC)     Eber Hong, MD 06/19/18 2127

## 2018-06-19 NOTE — ED Triage Notes (Signed)
Patient complaining of continuing shortness of breath since discharge from hospital on Saturday.

## 2018-06-19 NOTE — ED Notes (Signed)
Date and time results received: 06/19/18 8:45 PM (use smartphrase ".now" to insert current time)  Test: troponin Critical Value: 0.05  Name of Provider Notified: Dr Hyacinth Meeker  Orders Received? Or Actions Taken?: no new orders received.

## 2018-06-20 ENCOUNTER — Encounter (HOSPITAL_COMMUNITY): Payer: Self-pay | Admitting: Student

## 2018-06-20 ENCOUNTER — Inpatient Hospital Stay (HOSPITAL_COMMUNITY): Payer: Medicare Other

## 2018-06-20 DIAGNOSIS — I959 Hypotension, unspecified: Secondary | ICD-10-CM | POA: Diagnosis present

## 2018-06-20 DIAGNOSIS — E876 Hypokalemia: Secondary | ICD-10-CM | POA: Diagnosis present

## 2018-06-20 DIAGNOSIS — I480 Paroxysmal atrial fibrillation: Secondary | ICD-10-CM | POA: Diagnosis present

## 2018-06-20 DIAGNOSIS — Z95810 Presence of automatic (implantable) cardiac defibrillator: Secondary | ICD-10-CM

## 2018-06-20 DIAGNOSIS — Z681 Body mass index (BMI) 19 or less, adult: Secondary | ICD-10-CM | POA: Diagnosis not present

## 2018-06-20 DIAGNOSIS — I4891 Unspecified atrial fibrillation: Secondary | ICD-10-CM | POA: Diagnosis not present

## 2018-06-20 DIAGNOSIS — I361 Nonrheumatic tricuspid (valve) insufficiency: Secondary | ICD-10-CM

## 2018-06-20 DIAGNOSIS — I482 Chronic atrial fibrillation, unspecified: Secondary | ICD-10-CM | POA: Diagnosis present

## 2018-06-20 DIAGNOSIS — I110 Hypertensive heart disease with heart failure: Secondary | ICD-10-CM | POA: Diagnosis present

## 2018-06-20 DIAGNOSIS — J810 Acute pulmonary edema: Secondary | ICD-10-CM | POA: Diagnosis present

## 2018-06-20 DIAGNOSIS — R7989 Other specified abnormal findings of blood chemistry: Secondary | ICD-10-CM

## 2018-06-20 DIAGNOSIS — J9601 Acute respiratory failure with hypoxia: Secondary | ICD-10-CM

## 2018-06-20 DIAGNOSIS — I10 Essential (primary) hypertension: Secondary | ICD-10-CM

## 2018-06-20 DIAGNOSIS — Z7189 Other specified counseling: Secondary | ICD-10-CM | POA: Diagnosis not present

## 2018-06-20 DIAGNOSIS — I472 Ventricular tachycardia: Secondary | ICD-10-CM

## 2018-06-20 DIAGNOSIS — J441 Chronic obstructive pulmonary disease with (acute) exacerbation: Secondary | ICD-10-CM

## 2018-06-20 DIAGNOSIS — E039 Hypothyroidism, unspecified: Secondary | ICD-10-CM | POA: Diagnosis present

## 2018-06-20 DIAGNOSIS — Z87891 Personal history of nicotine dependence: Secondary | ICD-10-CM | POA: Diagnosis not present

## 2018-06-20 DIAGNOSIS — E119 Type 2 diabetes mellitus without complications: Secondary | ICD-10-CM | POA: Diagnosis present

## 2018-06-20 DIAGNOSIS — I25118 Atherosclerotic heart disease of native coronary artery with other forms of angina pectoris: Secondary | ICD-10-CM

## 2018-06-20 DIAGNOSIS — Z515 Encounter for palliative care: Secondary | ICD-10-CM | POA: Diagnosis not present

## 2018-06-20 DIAGNOSIS — Z955 Presence of coronary angioplasty implant and graft: Secondary | ICD-10-CM | POA: Diagnosis not present

## 2018-06-20 DIAGNOSIS — I2510 Atherosclerotic heart disease of native coronary artery without angina pectoris: Secondary | ICD-10-CM

## 2018-06-20 DIAGNOSIS — Z8249 Family history of ischemic heart disease and other diseases of the circulatory system: Secondary | ICD-10-CM | POA: Diagnosis not present

## 2018-06-20 DIAGNOSIS — I5033 Acute on chronic diastolic (congestive) heart failure: Secondary | ICD-10-CM

## 2018-06-20 DIAGNOSIS — E440 Moderate protein-calorie malnutrition: Secondary | ICD-10-CM | POA: Diagnosis present

## 2018-06-20 DIAGNOSIS — I340 Nonrheumatic mitral (valve) insufficiency: Secondary | ICD-10-CM | POA: Diagnosis not present

## 2018-06-20 DIAGNOSIS — Z7901 Long term (current) use of anticoagulants: Secondary | ICD-10-CM | POA: Diagnosis not present

## 2018-06-20 DIAGNOSIS — F329 Major depressive disorder, single episode, unspecified: Secondary | ICD-10-CM | POA: Diagnosis present

## 2018-06-20 DIAGNOSIS — I509 Heart failure, unspecified: Secondary | ICD-10-CM

## 2018-06-20 DIAGNOSIS — I248 Other forms of acute ischemic heart disease: Secondary | ICD-10-CM | POA: Diagnosis present

## 2018-06-20 DIAGNOSIS — D649 Anemia, unspecified: Secondary | ICD-10-CM | POA: Diagnosis present

## 2018-06-20 DIAGNOSIS — Z950 Presence of cardiac pacemaker: Secondary | ICD-10-CM

## 2018-06-20 DIAGNOSIS — Z79890 Hormone replacement therapy (postmenopausal): Secondary | ICD-10-CM | POA: Diagnosis not present

## 2018-06-20 DIAGNOSIS — E785 Hyperlipidemia, unspecified: Secondary | ICD-10-CM | POA: Diagnosis present

## 2018-06-20 DIAGNOSIS — Z79899 Other long term (current) drug therapy: Secondary | ICD-10-CM | POA: Diagnosis not present

## 2018-06-20 LAB — CBC WITH DIFFERENTIAL/PLATELET
ABS IMMATURE GRANULOCYTES: 0.07 10*3/uL (ref 0.00–0.07)
Basophils Absolute: 0 10*3/uL (ref 0.0–0.1)
Basophils Relative: 0 %
Eosinophils Absolute: 0 10*3/uL (ref 0.0–0.5)
Eosinophils Relative: 0 %
HCT: 27.3 % — ABNORMAL LOW (ref 39.0–52.0)
Hemoglobin: 8.8 g/dL — ABNORMAL LOW (ref 13.0–17.0)
Immature Granulocytes: 1 %
Lymphocytes Relative: 10 %
Lymphs Abs: 1.1 10*3/uL (ref 0.7–4.0)
MCH: 31.5 pg (ref 26.0–34.0)
MCHC: 32.2 g/dL (ref 30.0–36.0)
MCV: 97.8 fL (ref 80.0–100.0)
Monocytes Absolute: 0.8 10*3/uL (ref 0.1–1.0)
Monocytes Relative: 7 %
NRBC: 0 % (ref 0.0–0.2)
Neutro Abs: 9.2 10*3/uL — ABNORMAL HIGH (ref 1.7–7.7)
Neutrophils Relative %: 82 %
Platelets: 210 10*3/uL (ref 150–400)
RBC: 2.79 MIL/uL — ABNORMAL LOW (ref 4.22–5.81)
RDW: 13.7 % (ref 11.5–15.5)
WBC: 11.1 10*3/uL — ABNORMAL HIGH (ref 4.0–10.5)

## 2018-06-20 LAB — MAGNESIUM: Magnesium: 2.1 mg/dL (ref 1.7–2.4)

## 2018-06-20 LAB — COMPREHENSIVE METABOLIC PANEL
ALT: 34 U/L (ref 0–44)
ANION GAP: 8 (ref 5–15)
AST: 33 U/L (ref 15–41)
Albumin: 2.7 g/dL — ABNORMAL LOW (ref 3.5–5.0)
Alkaline Phosphatase: 68 U/L (ref 38–126)
BILIRUBIN TOTAL: 1.8 mg/dL — AB (ref 0.3–1.2)
BUN: 24 mg/dL — ABNORMAL HIGH (ref 8–23)
CO2: 23 mmol/L (ref 22–32)
Calcium: 7.7 mg/dL — ABNORMAL LOW (ref 8.9–10.3)
Chloride: 109 mmol/L (ref 98–111)
Creatinine, Ser: 0.96 mg/dL (ref 0.61–1.24)
GFR calc Af Amer: >60 mL/min (ref >60)
GFR calc non Af Amer: >60 mL/min (ref >60)
Glucose, Bld: 110 mg/dL — ABNORMAL HIGH (ref 70–99)
Potassium: 3.4 mmol/L — ABNORMAL LOW (ref 3.5–5.1)
Sodium: 140 mmol/L (ref 135–145)
Total Protein: 5.3 g/dL — ABNORMAL LOW (ref 6.5–8.1)

## 2018-06-20 LAB — ECHOCARDIOGRAM COMPLETE
Height: 70 in
Weight: 2081.14 oz

## 2018-06-20 LAB — TROPONIN I
Troponin I: 0.05 ng/mL (ref <0.03)
Troponin I: 0.04 ng/mL (ref <0.03)

## 2018-06-20 LAB — TSH: TSH: 1.173 u[IU]/mL (ref 0.350–4.500)

## 2018-06-20 MED ORDER — METHYLPREDNISOLONE SODIUM SUCC 125 MG IJ SOLR
60.0000 mg | Freq: Two times a day (BID) | INTRAMUSCULAR | Status: DC
  Administered 2018-06-20 – 2018-06-21 (×3): 60 mg via INTRAVENOUS
  Filled 2018-06-20 (×3): qty 2

## 2018-06-20 MED ORDER — LEVALBUTEROL HCL 0.63 MG/3ML IN NEBU
0.6300 mg | INHALATION_SOLUTION | Freq: Four times a day (QID) | RESPIRATORY_TRACT | Status: DC
  Administered 2018-06-20 – 2018-06-21 (×6): 0.63 mg via RESPIRATORY_TRACT
  Filled 2018-06-20 (×6): qty 3

## 2018-06-20 MED ORDER — BUDESONIDE 0.5 MG/2ML IN SUSP
0.5000 mg | Freq: Two times a day (BID) | RESPIRATORY_TRACT | Status: DC
  Administered 2018-06-20 – 2018-06-25 (×11): 0.5 mg via RESPIRATORY_TRACT
  Filled 2018-06-20 (×11): qty 2

## 2018-06-20 MED ORDER — IPRATROPIUM BROMIDE 0.02 % IN SOLN
0.5000 mg | Freq: Four times a day (QID) | RESPIRATORY_TRACT | Status: DC
  Administered 2018-06-20 – 2018-06-21 (×7): 0.5 mg via RESPIRATORY_TRACT
  Filled 2018-06-20 (×7): qty 2.5

## 2018-06-20 MED ORDER — METOPROLOL TARTRATE 25 MG PO TABS
12.5000 mg | ORAL_TABLET | Freq: Two times a day (BID) | ORAL | Status: DC
  Administered 2018-06-20 – 2018-06-25 (×10): 12.5 mg via ORAL
  Filled 2018-06-20 (×11): qty 1

## 2018-06-20 NOTE — Consult Note (Addendum)
Cardiology Consult    Patient ID: Anthony Charles; [id number]; 03/16/1946   Admit date: 06/19/2018 Date of Consult: 06/20/2018  Primary Care Provider: [facility] Primary Cardiologist: [facility] - Dr. Purvis SheffieldKoneswaran; [place]  Patient Profile    Anthony Charles is a 73 y.o. 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 Arbutus Leasat.   History of Present Illness    Anthony Charles was recently admitted to [facility] from 3/11 - 06/16/2018 for acute hypoxic respiratory failure in the setting of Influenza A. Was appropriately treated with Tamiflu and supportive measures. By review of notes, he maintained NSR during admission and was continued on PTA Amiodarone, Eliquis, and Toprol-XL.   He presented back to [facility] ED on 06/19/2018 for worsening dyspnea since hospital discharge. In talking with the patient today, he reports an extensive cardiac history and says that he had stents placed initially in 2016 and a new stent in 2018. 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 He is unsure what brand is device is but says "[employer]" sounds familiar.  Reports that on the day of admission he was experiencing worsening dyspnea on exertion and orthopnea. Denies any associated chest pain or palpitations. Reports good compliance with his medications prior to admission and denies missing any recent doses. Says [place] has lived in [place] over [place] but [place] to [place] at least twice a [place] [facility].  His primary cardiologist there is Dr. Alcario DroughtHamati.   Initial labs this admission show WBC 12.4, Hgb 9.5,  platelets 214, Na+ 141, K+ 3.1, and creatinine 0.91.  BNP elevated to 1319.  Initial and cyclic troponin values have been flat at 0.05, 0.05, and 0.04.  CXR shows mild pulmonary edema.  EKG showed atrial fibrillation with RVR, heart rate 148.  He was started on IV Cardizem for rate control and continued on Eliquis and Amiodarone.  Rates have been in the low-100's to 120's overnight and IV Cardizem has been reduced to 5 mg/hr. Does have intermittent AV pacing by review of telemetry. Was also started on IV Lasix 20 mg twice daily for diuresis.   Past Medical History:  Diagnosis Date  . Agent orange exposure   . AICD (automatic cardioverter/defibrillator) present   . Aneurysm (HCC)   . Atrial fibrillation (HCC)   . CAD (coronary artery disease)   . Diabetes mellitus without complication (HCC)   . Hernia of abdominal wall   . Thyroid disease     Past Surgical History:  Procedure Laterality Date  . pacemaker/defibrillator       Home Medications:  Prior to Admission medications   Medication Sig Start Date End Date Taking? Authorizing Provider  amiodarone (PACERONE) 200 MG tablet Take 100 mg by mouth daily.   Yes [provider]  apixaban (ELIQUIS) 5 MG TABS tablet Take 5 mg by mouth 2 (two) times daily.   Yes [provider]  atorvastatin (LIPITOR) 20 MG tablet Take 20 mg by mouth at bedtime.   Yes [provider]  cetirizine (ZYRTEC) 10 MG tablet Take 5 mg by mouth daily.  Yes [provider]  ibuprofen (ADVIL,MOTRIN) 600 MG tablet Take 600 mg by mouth every 6 (six) hours as needed for mild pain or moderate pain.   Yes [provider]  ipratropium-albuterol (DUONEB) 0.5-2.5 (3) MG/3ML SOLN Take 3 mLs by nebulization every 6 (six) hours as needed (dyspnea or wheezing). 06/16/18  Yes Shah, Pratik D, DO  levothyroxine (SYNTHROID, LEVOTHROID) 112 MCG tablet Take 112 mcg by mouth every morning.   Yes [provider]  metoprolol succinate  (TOPROL-XL) 100 MG 24 hr tablet Take 100 mg by mouth daily. Take with or immediately following a meal.    Yes [provider]  Multiple Vitamin (MULTIVITAMIN WITH MINERALS) TABS tablet Take 1 tablet by mouth daily.   Yes [provider]  promethazine (PHENERGAN) 25 MG tablet Take 25 mg by mouth every 6 (six) hours as needed for nausea or vomiting.   Yes [provider]  traZODone (DESYREL) 100 MG tablet Take 200 mg by mouth at bedtime.   Yes [provider]  dextromethorphan-guaiFENesin (MUCINEX DM) 30-600 MG 12hr tablet Take 1 tablet by mouth 2 (two) times daily for 10 days. Patient not taking: Reported on 06/19/2018 06/16/18 06/26/18  Maurilio Lovely D, DO  feeding supplement, ENSURE ENLIVE, (ENSURE ENLIVE) LIQD Take 237 mLs by mouth 2 (two) times daily between meals. Patient not taking: Reported on 06/19/2018 06/16/18   Sherryll Burger, Pratik D, DO  Ipratropium-Albuterol (COMBIVENT RESPIMAT) 20-100 MCG/ACT AERS respimat Inhale 1 puff into the lungs every 6 (six) hours as needed for wheezing or shortness of breath. Patient not taking: Reported on 06/19/2018 06/16/18   Maurilio Lovely D, DO  oseltamivir (TAMIFLU) 75 MG capsule Take 1 capsule (75 mg total) by mouth every 12 (twelve) hours. Patient not taking: Reported on 06/19/2018 06/13/18   Raeford Razor, MD  predniSONE (DELTASONE) 20 MG tablet Take 2 tablets (40 mg total) by mouth daily for 5 days. Patient not taking: Reported on 06/19/2018 06/16/18 06/21/18  Maurilio Lovely D, DO    Inpatient Medications: Scheduled Meds: . amiodarone  100 mg Oral Daily  . apixaban  5 mg Oral BID  . atorvastatin  20 mg Oral q1800  . budesonide (PULMICORT) nebulizer solution  0.5 mg Nebulization BID  . dextromethorphan-guaiFENesin  1 tablet Oral BID  . furosemide  20 mg Intravenous BID  . ipratropium  0.5 mg Nebulization Q6H  . levalbuterol  0.63 mg Nebulization Q6H  . levothyroxine  112 mcg Oral Q0600  . methylPREDNISolone (SOLU-MEDROL) injection   60 mg Intravenous Q12H  . potassium chloride  20 mEq Oral BID  . sodium chloride flush  3 mL Intravenous Q12H   Continuous Infusions: . sodium chloride    . diltiazem (CARDIZEM) infusion 5 mg/hr (06/20/18 0553)  . magnesium sulfate 1 - 4 g bolus IVPB     PRN Meds: sodium chloride, acetaminophen, ipratropium-albuterol, ondansetron (ZOFRAN) IV, sodium chloride flush, zolpidem  Allergies:   No Known Allergies  Social History:   Social History   Socioeconomic History  . Marital status: Married    Spouse name: Not on file  . Number of children: Not on file  . Years of education: Not on file  . Highest education level: Not on file  Occupational History  . Not on file  Social Needs  . Financial resource strain: Not on file  . Food insecurity:    Worry: Not on file    Inability: Not on file  . Transportation needs:    Medical: Not on  file    Non-medical: Not on file  Tobacco Use  . Smoking status: Former Games developer  . Smokeless tobacco: Never Used  Substance and Sexual Activity  . Alcohol use: Never    Frequency: Never  . Drug use: Never  . Sexual activity: Never  Lifestyle  . Physical activity:    Days per week: Not on file    Minutes per session: Not on file  . Stress: Not on file  Relationships  . Social connections:    Talks on phone: Not on file    Gets together: Not on file    Attends religious service: Not on file    Active member of club or organization: Not on file    Attends meetings of clubs or organizations: Not on file    Relationship status: Not on file  . Intimate partner violence:    Fear of current or ex partner: Not on file    Emotionally abused: Not on file    Physically abused: Not on file    Forced sexual activity: Not on file  Other Topics Concern  . Not on file  Social History Narrative  . Not on file     Family History:   Family History  Problem Relation Age of Onset  . CAD Father       Review of Systems    General:  No chills,  fever, night sweats or weight changes.  Cardiovascular:  No chest pain, orthopnea, palpitations, paroxysmal nocturnal dyspnea. Positive for dyspnea and edema.  Dermatological: No rash, lesions/masses Respiratory: No cough, Positive for dyspnea.  Urologic: No hematuria, dysuria Abdominal:   No nausea, vomiting, diarrhea, bright red blood per rectum, melena, or hematemesis Neurologic:  No visual changes, wkns, changes in mental status. All other systems reviewed and are otherwise negative except as noted above.  Physical Exam/Data    Vitals:   06/20/18 0800 06/20/18 0813 06/20/18 0900 06/20/18 1046  BP: (!) 87/62  113/67 105/67  Pulse: 88  87 (!) 107  Resp: (!) 33  (!) 29 17  Temp:      TempSrc:      SpO2: 92% 96% 94% 95%  Weight:      Height:        Intake/Output Summary (Last 24 hours) at 06/20/2018 1103 Last data filed at 06/20/2018 1044 Gross per 24 hour  Intake 89.81 ml  Output 1200 ml  Net -1110.19 ml   Filed Weights   06/19/18 1920 06/20/18 0035 06/20/18 0500  Weight: 64 kg 59 kg 59 kg   Body mass index is 18.66 kg/m.   General: Pleasant, Caucasian male appearing in NAD Psych: Normal affect. Neuro: Alert and oriented X 3. Moves all extremities spontaneously. HEENT: Normal  Neck: Supple without bruits or JVD. Lungs:  Resp regular and unlabored, rales along bases with mild expiratory wheezing throughout. Heart: Irregularly irregular. No s3, s4, or murmurs. Abdomen: Soft, non-tender, non-distended, BS + x 4.  Extremities: No clubbing or cyanosis. Trace ankle edema. DP/PT/Radials 2+ and equal bilaterally.   Labs/Studies     Relevant CV Studies:  Echocardiogram: Pending  Laboratory Data:  Chemistry Recent Labs  Lab 06/15/18 0413 06/19/18 1932 06/20/18 0611  NA 143 141 140  K 4.2 3.1* 3.4*  CL 119* 111 109  CO2 19* 19* 23  GLUCOSE 128* 115* 110*  BUN 25* 27* 24*  CREATININE 1.02 0.91 0.96  CALCIUM 7.5* 8.0* 7.7*  GFRNONAA >60 >60 >60  GFRAA >60 >60  >60  ANIONGAP  Recent Labs  Lab 06/15/18 0413 06/19/18 1932 06/20/18 0611  PROT 5.2* 5.9* 5.3*  ALBUMIN 2.7* 3.1* 2.7*  AST 57* 42* 33  ALT 26 41 34  ALKPHOS 76 78 68  BILITOT 0.6 2.0* 1.8*   Hematology Recent Labs  Lab 06/15/18 0413 06/19/18 1932 06/20/18 0611  WBC 6.0 12.4* 11.1*  RBC 2.94* 2.99* 2.79*  HGB 9.5* 9.5* 8.8*  HCT 29.9* 29.3* 27.3*  MCV 101.7* 98.0 97.8  MCH 32.3 31.8 31.5  MCHC 31.8 32.4 32.2  RDW 13.4 14.0 13.7  PLT 134* 214 210   Cardiac Enzymes Recent Labs  Lab 06/19/18 1932 06/20/18 0016 06/20/18 0611  TROPONINI 0.05* 0.05* 0.04*   No results for input(s): TROPIPOC in the last 168 hours.  BNP Recent Labs  Lab 06/19/18 1932  BNP 1,319.0*    DDimer No results for input(s): DDIMER in the last 168 hours.  Radiology/Studies:  Dg Chest Port 1 View  Result Date: 06/19/2018 CLINICAL DATA:  Cough, shortness of breath and chest pain. EXAM: PORTABLE CHEST 1 VIEW COMPARISON:  06/13/2018 FINDINGS: Stable heart size and appearance of dual-chamber pacemaker. Since the prior study, there has been development of mild pulmonary edema. No pleural effusions or focal airspace consolidation. No pneumothorax. Stable chronic lung disease. IMPRESSION: Development of mild pulmonary edema. Electronically Signed   By: Irish Lack M.D.   On: 06/19/2018 20:30     Assessment & Plan    1. Paroxysmal Atrial Fibrillation - rates have improved with the use of IV Cardizem and are currently in the low 100's on IV Cardizem at 5 mg/hr. He is on Toprol-XL 100 mg daily prior to admission but this was held giving his soft BP. Would plan to start low-dose PO Lopressor 12.5mg  BID and wean drip as able. Continue Amiodarone  daily (by patient's history I suspect he had been on this for sustained VT per his report as he says they "started the medication to keep his device from firing so frequently"). If BP does not allow for further titration of AV nodal blocking  agents, could temporarily increase PO Amiodarone.  - he denies any evidence of active bleeding. Continue Eliquis  BID for anticoagulation.   2. Acute CHF Exacerbation (Echo pending to determine Systolic vs. Diastolic Dysfunction) - BNP elevated to 1319 on admission and CXR shows mild pulmonary edema. He was started on IV Lasix  BID and has diuresed -1.1L thus far. Creatinine remains stable at 0.96 and K+ slightly low at 3.4 with supplementation already ordered. Would continue with low-dose IV Lasix today given soft BP. Can likely be titrated as Cardizem is weaned.  - echo is pending. If EF reduced, would favor BB therapy or Cardizem for rate-control.   3. CAD/ Presumed Prior VT - Reports prior cardiac stenting in 2016 and 2018. He has experienced worsening dyspnea but denies any chest pain. Cyclic troponin values have been flat at 0.05, 0.05, and 0.04 this admission which is most consistent with demand ischemia and not ACS. - Repeat echocardiogram is pending to assess EF and wall motion. Will try to see if we can obtain records from his primary cardiologist (Dr. Alcario Drought in [place], [place]).  - continue Amiodarone and statin therapy. BB currently held given hypotension. Not on ASA given the need for anticoagulation. Would like to interrogate his device but these services are limited due to no device representatives currently allowed in the hospital.   4. HTN - he has actually  been hypotensive since being started on IV Cardizem. Will continue to wean.    5. HLD - has been continued on PTA Atorvastatin 20mg  daily.   6. COPD - he has been started on scheduled nebulizers and IV steroids.  - per admitting team.    For questions or updates, please contact [facility] Please consult [email or web address] for contact info under Cardiology/STEMI.  Signed, Ellsworth Lennox, PA-C 06/20/2018, 11:03 AM Pager: [phone]  The patient was seen and examined, and I agree with the history, physical  exam, assessment and plan as documented above, with modifications as noted below. I have also personally reviewed all relevant documentation, old records, labs, and both radiographic and cardiovascular studies. I have also independently interpreted old and new ECG's.  Briefly, this is a 73 year old male with a history of coronary artery disease dating back to at least 2016.  He used to live in [place], [place], and told me he had a heart attack in 2000 but did not seek medical care.  He then moved to [place], [place]. He told me he underwent stent placement in 2016 and in 2018.  He also had defibrillator and pacemaker placement.  His cardiologist at the [facility] in [place], [place], is Dr. Alcario Drought. He and his wife make the 4-hour trip there twice per month for both cardiac care and primary care.  It appears he has had several shocks from his ICD but he cannot remember the last time it fired.  He was recently hospitalized for acute hypoxic respiratory failure secondary to influenza A.  He then presented to the ED at [facility] on 06/19/2018 for worsening shortness of breath.  He was found to be in rapid atrial fibrillation and decompensated CHF.  BNP elevated to 1319 and chest x-ray showed mild pulmonary edema.  He was started on IV diltiazem drip for heart rate control.  He was continued on Eliquis and amiodarone.  He is currently on 5 mg/h with a heart rate in the low 100 bpm range and up to the 110 bpm range during my evaluation.  He received IV Lasix 20 mg twice daily and has put out over 1.7 L thus far.  His shortness of breath has improved.  The plan will be to obtain an echocardiogram to evaluate cardiac structure and function.  We will try to obtain cardiac records from [place], [place].  Troponins are nonspecifically elevated and are indicative of demand ischemia in the context of decompensated heart failure and rapid atrial fibrillation.  Blood pressures are soft.  He  had been on Toprol-XL but this has been held for this reason.  We will start low-dose metoprolol tartrate 12.5 mg twice daily and attempt to wean him off the diltiazem drip.  We will continue amiodarone 100 mg daily likely for VT suppression.  Continue Eliquis for systemic anticoagulation.  Unfortunately, we are unable to interrogate his device due to the COVID19 pandemic (device reps not allowed in hospital at the present time).  I have also offered him more local cardiac care as he lives in [place] and we have an office there.  He is seriously going to consider this.   Prentice Docker, MD, [facility]  06/20/2018 1:01 PM

## 2018-06-20 NOTE — Progress Notes (Addendum)
PROGRESS NOTE  Anthony Charles XBJ:[id number] DOB: 04-Dec-1945 DOA: 06/19/2018 [facility]CP[facility]  Brief History:  73 year old male with a history of chronic atrial fibrillation, diastolic CHF, CAD, defibrillator placement, and COPD presenting with 3-day history of shortness of breath, lower extremity edema, and orthopnea type symptoms.  The patient was recently admitted to the hospital from 06/13/2018 through 06/16/2018 for sepsis and respiratory failure secondary to influenza A.  The patient was discharged home with 2 more days of oseltamavir and 5 days of prednisone.  He stated that he finished the oseltamavir.  Nevertheless, the patient stated that he began getting more short of breath on 06/17/2018 with orthopnea type symptoms.  He denies any fevers, chills, sore throat, headache, hemoptysis, nausea, vomiting, diarrhea.  He continues to have cough and some wheezing.  As result, he presented for further evaluation.  In the ED, patient had temperature 100.0 with heart rate in the 140s.  He was noted to have atrial fibrillation with RVR.  Oxygen saturation was noted to be 89% on room air.  He was placed on supplemental oxygen.  He was started on diltiazem drip.  Since admission, the patient's systolic blood pressure has been in the upper 80s on the diltiazem drip.  Cardiology was consulted to assist with management.  Assessment/Plan: Acute respiratory failure with hypoxia -Secondary to COPD exacerbation and pulmonary edema -Presently stable on 2 L -Wean oxygen for saturation greater 92%  Acute on chronic diastolic CHF -Continue IV furosemide -Remains clinically fluid overloaded with JVD -Daily weights -Accurate I's and O's -Echocardiogram -Cardiology consult  Chronic atrial fibrillation, with RVR -Systolic blood pressure is soft on diltiazem drip -Holding metoprolol succinate -Cardiology consult -TSH 1.173 -Echocardiogram -continue po amio pending cardiology consult  -continue apixaban  COPD exacerbation -Start IV Solu-Medrol -Start Pulmicort -Start duo nebs  Elevated troponin/Coronary artery disease -Secondary to demand ischemia -No chest pain presently -Personally reviewed EKG--atrial fibrillation with nonspecific T wave changes  AICD in situ -placed in [place], [place] -last shocked 6 months ago -states "I 've died 34 times" -cardiology consult  Hypokalemia -Replete  Moderate malnutrition -Continue supplements  Hypothyroidism -Continue Synthroid -TSH 1.173  Hyperbilirubinemia -likely due to hepatic congestion from CHF -trend     Disposition Plan:   Home in 2-3 days  Family Communication: No  Family at bedside  Consultants:  cardiology  Code Status:  FULL   DVT Prophylaxis:  apixaban   Procedures: As Listed in Progress Note Above  Antibiotics: None       Subjective: Patient states that he is breathing a little better than yesterday.  He denies any headache, sore throat, fevers, chills, chest pain, nausea, vomiting, diarrhea, hemoptysis.  He has a nonproductive cough.  He complains of some wheezing.  Objective: Vitals:   06/20/18 0200 06/20/18 0300 06/20/18 0400 06/20/18 0500  BP: (!) 76/61 (!) 81/55 (!) 87/60 (!) 89/62  Pulse: 100 (!) 110 (!) 104 (!) 106  Resp: (!) 23 (!) 30 (!) 29 (!) 27  Temp:   98 F (36.7 C)   TempSrc:   Oral   SpO2: (!) 89% 91% 93% 92%  Weight:    59 kg  Height:        Intake/Output Summary (Last 24 hours) at 06/20/2018 0733 Last data filed at 06/20/2018 0646 Gross per 24 hour  Intake 89.81 ml  Output 1000 ml  Net -910.19 ml   Weight change:  Exam:   General:  Pt  is alert, follows commands appropriately, not in acute distress  HEENT: No icterus, No thrush, No neck mass, [place]/AT  Cardiovascular: RRR, S1/S2, no rubs, no gallops  Respiratory: bibasilar rales, bilateral expiratory wheeze  Abdomen: Soft/+BS, non tender, non distended, no guarding  Extremities: 1 + LE  edema, No lymphangitis, No petechiae, No rashes, no synovitis   Data Reviewed: I have personally reviewed following labs and imaging studies Basic Metabolic Panel: Recent Labs  Lab 06/13/18 1335 06/14/18 0524 06/15/18 0413 06/19/18 1932 06/20/18 0611  NA 139 142 143 141 140  K 3.2* 3.7 4.2 3.1* 3.4*  CL 107 117* 119* 111 109  CO2 23 17* 19* 19* 23  GLUCOSE 116* 136* 128* 115* 110*  BUN 15 20 25* 27* 24*  CREATININE 1.21 1.21 1.02 0.91 0.96  CALCIUM 8.7* 7.1* 7.5* 8.0* 7.7*  MG  --   --   --   --  2.1   Liver Function Tests: Recent Labs  Lab 06/15/18 0413 06/19/18 1932 06/20/18 0611  AST 57* 42* 33  ALT 26 41 34  ALKPHOS 76 78 68  BILITOT 0.6 2.0* 1.8*  PROT 5.2* 5.9* 5.3*  ALBUMIN 2.7* 3.1* 2.7*   No results for input(s): LIPASE, AMYLASE in the last 168 hours. No results for input(s): AMMONIA in the last 168 hours. Coagulation Profile: Recent Labs  Lab 06/13/18 2149  INR 1.4*   CBC: Recent Labs  Lab 06/13/18 1335 06/14/18 0524 06/15/18 0413 06/19/18 1932 06/20/18 0611  WBC 8.4 6.3 6.0 12.4* 11.1*  NEUTROABS 7.3  --   --  10.7* 9.2*  HGB 12.1* 9.8* 9.5* 9.5* 8.8*  HCT 34.5* 32.0* 29.9* 29.3* 27.3*  MCV 100.0 105.6* 101.7* 98.0 97.8  PLT 189 122* 134* 214 210   Cardiac Enzymes: Recent Labs  Lab 06/19/18 1932 06/20/18 0016 06/20/18 0611  TROPONINI 0.05* 0.05* 0.04*   BNP: Invalid input(s): POCBNP CBG: No results for input(s): GLUCAP in the last 168 hours. HbA1C: No results for input(s): HGBA1C in the last 72 hours. Urine analysis:    Component Value Date/Time   COLORURINE YELLOW 06/13/2018 1309   APPEARANCEUR HAZY (A) 06/13/2018 1309   LABSPEC 1.026 06/13/2018 1309   PHURINE 5.0 06/13/2018 1309   GLUCOSEU NEGATIVE 06/13/2018 1309   HGBUR SMALL (A) 06/13/2018 1309   BILIRUBINUR NEGATIVE 06/13/2018 1309   KETONESUR 20 (A) 06/13/2018 1309   PROTEINUR 30 (A) 06/13/2018 1309   NITRITE NEGATIVE 06/13/2018 1309   LEUKOCYTESUR NEGATIVE  06/13/2018 1309   Sepsis Labs: @LABRCNTIP (procalcitonin:4,lacticidven:4) ) Recent Results (from the past 240 hour(s))  Blood culture (routine x 2)     Status: None   Collection Time: 06/13/18  1:20 PM  Result Value Ref Range Status   Specimen Description RIGHT ANTECUBITAL  Final   Special Requests   Final    BOTTLES DRAWN AEROBIC AND ANAEROBIC Blood Culture adequate volume   Culture   Final    NO GROWTH 5 DAYS Performed at [facility], [address], [place], [place] [address]    Report Status 06/18/2018 FINAL  Final  Blood culture (routine x 2)     Status: None   Collection Time: 06/13/18  1:45 PM  Result Value Ref Range Status   Specimen Description LEFT ANTECUBITAL  Final   Special Requests   Final    BOTTLES DRAWN AEROBIC AND ANAEROBIC Blood Culture adequate volume   Culture   Final    NO GROWTH 5 DAYS Performed at [facility], [address],  [place], [place] [address]    Report Status 06/18/2018 FINAL  Final  MRSA PCR Screening     Status: None   Collection Time: 06/13/18  9:37 PM  Result Value Ref Range Status   MRSA by PCR NEGATIVE NEGATIVE Final    Comment:        The GeneXpert MRSA Assay (FDA approved for NASAL specimens only), is one component of a comprehensive MRSA colonization surveillance program. It is not intended to diagnose MRSA infection nor to guide or monitor treatment for MRSA infections. Performed at [facility], [address], [place], [place] [address]   Blood culture (routine x 2)     Status: None (Preliminary result)   Collection Time: 06/19/18  7:32 PM  Result Value Ref Range Status   Specimen Description BLOOD RIGHT FOREARM  Final   Special Requests   Final    BOTTLES DRAWN AEROBIC AND ANAEROBIC Blood Culture adequate volume   Culture   Final    NO GROWTH < 12 HOURS Performed at [facility], [address] [place], [place] [address]    Report Status PENDING  Incomplete  Blood culture (routine x 2)     Status: None (Preliminary  result)   Collection Time: 06/19/18  7:37 PM  Result Value Ref Range Status   Specimen Description LEFT ANTECUBITAL  Final   Special Requests   Final    BOTTLES DRAWN AEROBIC AND ANAEROBIC Blood Culture adequate volume   Culture   Final    NO GROWTH < 12 HOURS Performed at [facility], [address], [place], [place] [address]    Report Status PENDING  Incomplete     Scheduled Meds: . amiodarone  100 mg Oral Daily  . apixaban  5 mg Oral BID  . atorvastatin  20 mg Oral q1800  . dextromethorphan-guaiFENesin  1 tablet Oral BID  . furosemide  20 mg Intravenous BID  . levothyroxine  112 mcg Oral Q0600  . potassium chloride  20 mEq Oral BID  . sodium chloride flush  3 mL Intravenous Q12H   Continuous Infusions: . sodium chloride    . diltiazem (CARDIZEM) infusion 5 mg/hr (06/20/18 0553)  . magnesium sulfate 1 - 4 g bolus IVPB      Procedures/Studies: Dg Chest 2 View  Result Date: 06/13/2018 CLINICAL DATA:  Weakness, body aches, cough EXAM: CHEST - 2 VIEW COMPARISON:  None. FINDINGS: Pulmonary hyperinflation and probable emphysematous change. No acute abnormality of the lungs. Left chest multi lead pacer defibrillator. Cardiomegaly. Disc degenerative disease of the thoracic spine. IMPRESSION: Pulmonary hyperinflation and probable emphysematous change. No acute appearing airspace opacity. Cardiomegaly. Electronically Signed   By: Lauralyn Primes M.D.   On: 06/13/2018 14:45   Dg Chest Port 1 View  Result Date: 06/19/2018 CLINICAL DATA:  Cough, shortness of breath and chest pain. EXAM: PORTABLE CHEST 1 VIEW COMPARISON:  06/13/2018 FINDINGS: Stable heart size and appearance of dual-chamber pacemaker. Since the prior study, there has been development of mild pulmonary edema. No pleural effusions or focal airspace consolidation. No pneumothorax. Stable chronic lung disease. IMPRESSION: Development of mild pulmonary edema. Electronically Signed   By: Irish Lack M.D.   On: 06/19/2018 20:30     Catarina Hartshorn, DO  [facility] Pager [phone]  If 7PM-7AM, please contact night-coverage [email or web address] Password [facility] 06/20/2018, 7:33 AM   LOS: 0 days

## 2018-06-20 NOTE — Progress Notes (Signed)
*  PRELIMINARY RESULTS* Echocardiogram 2D Echocardiogram has been performed.  Anthony Charles 06/20/2018, 1:53 PM

## 2018-06-21 ENCOUNTER — Inpatient Hospital Stay (HOSPITAL_COMMUNITY): Payer: Medicare Other

## 2018-06-21 DIAGNOSIS — I4891 Unspecified atrial fibrillation: Secondary | ICD-10-CM

## 2018-06-21 LAB — COMPREHENSIVE METABOLIC PANEL
ALT: 29 U/L (ref 0–44)
AST: 28 U/L (ref 15–41)
Albumin: 2.7 g/dL — ABNORMAL LOW (ref 3.5–5.0)
Alkaline Phosphatase: 70 U/L (ref 38–126)
Anion gap: 8 (ref 5–15)
BUN: 25 mg/dL — ABNORMAL HIGH (ref 8–23)
CO2: 27 mmol/L (ref 22–32)
Calcium: 7.8 mg/dL — ABNORMAL LOW (ref 8.9–10.3)
Chloride: 106 mmol/L (ref 98–111)
Creatinine, Ser: 0.9 mg/dL (ref 0.61–1.24)
GFR calc Af Amer: >60 mL/min (ref >60)
GFR calc non Af Amer: >60 mL/min (ref >60)
Glucose, Bld: 178 mg/dL — ABNORMAL HIGH (ref 70–99)
Potassium: 3.1 mmol/L — ABNORMAL LOW (ref 3.5–5.1)
SODIUM: 141 mmol/L (ref 135–145)
TOTAL PROTEIN: 5.4 g/dL — AB (ref 6.5–8.1)
Total Bilirubin: 1.1 mg/dL (ref 0.3–1.2)

## 2018-06-21 LAB — CBC
HCT: 27.9 % — ABNORMAL LOW (ref 39.0–52.0)
Hemoglobin: 8.9 g/dL — ABNORMAL LOW (ref 13.0–17.0)
MCH: 31.8 pg (ref 26.0–34.0)
MCHC: 31.9 g/dL (ref 30.0–36.0)
MCV: 99.6 fL (ref 80.0–100.0)
Platelets: 267 10*3/uL (ref 150–400)
RBC: 2.8 MIL/uL — ABNORMAL LOW (ref 4.22–5.81)
RDW: 13.7 % (ref 11.5–15.5)
WBC: 17.8 10*3/uL — ABNORMAL HIGH (ref 4.0–10.5)
nRBC: 0.1 % (ref 0.0–0.2)

## 2018-06-21 MED ORDER — AMIODARONE HCL IN DEXTROSE 360-4.14 MG/200ML-% IV SOLN: 30.0000 mg/h | INTRAVENOUS | Status: DC

## 2018-06-21 MED ORDER — LEVALBUTEROL HCL 0.63 MG/3ML IN NEBU
0.6300 mg | INHALATION_SOLUTION | Freq: Three times a day (TID) | RESPIRATORY_TRACT | Status: DC
  Administered 2018-06-22 (×2): 0.63 mg via RESPIRATORY_TRACT
  Filled 2018-06-21 (×2): qty 3

## 2018-06-21 MED ORDER — AMIODARONE HCL IN DEXTROSE 360-4.14 MG/200ML-% IV SOLN
60.0000 mg/h | INTRAVENOUS | Status: AC
  Administered 2018-06-21: 60 mg/h via INTRAVENOUS
  Filled 2018-06-21: qty 200

## 2018-06-21 MED ORDER — METHYLPREDNISOLONE SODIUM SUCC 40 MG IJ SOLR
40.0000 mg | Freq: Two times a day (BID) | INTRAMUSCULAR | Status: DC
  Administered 2018-06-22: 40 mg via INTRAVENOUS
  Filled 2018-06-21 (×2): qty 1

## 2018-06-21 MED ORDER — LORAZEPAM 2 MG/ML IJ SOLN
0.5000 mg | Freq: Four times a day (QID) | INTRAMUSCULAR | Status: DC | PRN
  Administered 2018-06-21: 0.5 mg via INTRAVENOUS
  Filled 2018-06-21: qty 1

## 2018-06-21 MED ORDER — AMIODARONE HCL IN DEXTROSE 360-4.14 MG/200ML-% IV SOLN
60.0000 mg/h | INTRAVENOUS | Status: AC
  Administered 2018-06-22 – 2018-06-23 (×6): 60 mg/h via INTRAVENOUS
  Filled 2018-06-21 (×6): qty 200

## 2018-06-21 MED ORDER — TRAZODONE HCL 50 MG PO TABS: 100.0000 mg | ORAL_TABLET | Freq: Every evening | ORAL | Status: DC | PRN

## 2018-06-21 MED ORDER — LEVALBUTEROL HCL 0.63 MG/3ML IN NEBU
0.6300 mg | INHALATION_SOLUTION | Freq: Three times a day (TID) | RESPIRATORY_TRACT | Status: DC
  Administered 2018-06-21: 0.63 mg via RESPIRATORY_TRACT
  Filled 2018-06-21: qty 3

## 2018-06-21 NOTE — BH Assessment (Signed)
Tele Assessment Note   Patient Name: Anthony Charles MRN: [id number] Referring Physician: Shon Hale, MD Location of Patient: [facility] ICU 03 Location of Provider: Behavioral Health TTS Department  Anthony Charles is an 73 y.o. male.  -Clinician reviewed note by Dr. Mariea Clonts.  Anthony Charles today has no fevers, no emesis,  No chest pain, patient is very emotional, tearful, verbalizing that he does not want to live anymore, he wants to be allowed to die,.... Phone call placed to patient's wife, apparently this is a normal occurrence for patient..... Patient's agitation from time to time patient will say that he is tired of being alive wants to die... She tells me this happens at least once a week--- telemetry psychiatric consult requested, safety sitter requested, may use lorazepam as needed agitation/anxiety.  Patient presents with passive suicidal ideation.  He says he is ready to die and will refuse medication to achieve that goal.  Patient talks about being ready to die and knowing "where I am going when I die."  Patient says, "I am tired of living."  Patient however denies any previous attempts to kill himself.  Patient denies any HI or A/V hallucinations.  Denies use of ETOH or other illicit drugs.  Patient denies access to weapons.    Patient can answer questions directly most of the time.  He does talk incoherently about religious matters.  He will go on for a period of time talking about his views on religion.  He has to be directed back to the question at hand occasionally.  Patient was asked if he had any family supports and he said no.  Clinician asked about talking to his wife to gather collateral information and patient asked that she not be contacted.  Dr. Fredirick Lathe note however says that wife reports that this type of thinking is a routine occurrence for him.     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 Agent orange exposure   . AICD (automatic cardioverter/defibrillator) present   . Aneurysm (HCC)   . Atrial fibrillation (HCC)   . CAD (coronary artery disease)   . Diabetes mellitus without complication (HCC)   . Hernia of abdominal wall   . Thyroid disease     Past Surgical History:  Procedure Laterality Date  . pacemaker/defibrillator      Family History:  Family History  Problem Relation Age of Onset  . CAD Father     Social History:  reports that he has quit smoking. He has never used smokeless tobacco. He reports that he does not drink alcohol or use drugs.  Additional Social History:  Alcohol / Drug Use Pain Medications: See medication list Prescriptions: See medication list Over the Counter: See medication list History of alcohol / drug use?: No history of alcohol / drug abuse  CIWA: CIWA-Ar BP: 119/69 Pulse Rate: (!) 109 COWS:    Allergies: No Known Allergies  Home Medications:  Medications Prior to Admission  Medication Sig Dispense Refill  . amiodarone (PACERONE) 200 MG tablet Take 100 mg by mouth daily.    Marland Kitchen apixaban (ELIQUIS) 5 MG TABS tablet Take 5 mg by mouth 2 (two) times daily.    Marland Kitchen atorvastatin (LIPITOR) 20 MG tablet Take 20 mg by mouth at bedtime.    . cetirizine (ZYRTEC) 10 MG tablet Take 5 mg by mouth daily.    Marland Kitchen ibuprofen (ADVIL,MOTRIN) 600 MG tablet Take 600 mg by mouth  every 6 (six) hours as needed for mild pain or moderate pain.    Marland Kitchen ipratropium-albuterol (DUONEB) 0.5-2.5 (3) MG/3ML SOLN Take 3 mLs by nebulization every 6 (six) hours as needed (dyspnea or wheezing). 360 mL 3  . levothyroxine (SYNTHROID, LEVOTHROID) 112 MCG tablet Take 112 mcg by mouth every morning.    . metoprolol succinate (TOPROL-XL) 100 MG 24 hr tablet Take 100 mg by mouth daily. Take with or immediately following a meal.     . Multiple Vitamin (MULTIVITAMIN WITH MINERALS) TABS tablet Take 1 tablet by mouth daily.    . promethazine (PHENERGAN) 25 MG tablet Take 25 mg by  mouth every 6 (six) hours as needed for nausea or vomiting.    . traZODone (DESYREL) 100 MG tablet Take 200 mg by mouth at bedtime.    Marland Kitchen dextromethorphan-guaiFENesin (MUCINEX DM) 30-600 MG 12hr tablet Take 1 tablet by mouth 2 (two) times daily for 10 days. (Patient not taking: Reported on 06/19/2018) 20 tablet 0  . feeding supplement, ENSURE ENLIVE, (ENSURE ENLIVE) LIQD Take 237 mLs by mouth 2 (two) times daily between meals. (Patient not taking: Reported on 06/19/2018) 237 mL 12  . Ipratropium-Albuterol (COMBIVENT RESPIMAT) 20-100 MCG/ACT AERS respimat Inhale 1 puff into the lungs every 6 (six) hours as needed for wheezing or shortness of breath. (Patient not taking: Reported on 06/19/2018) 1 Inhaler 3  . oseltamivir (TAMIFLU) 75 MG capsule Take 1 capsule (75 mg total) by mouth every 12 (twelve) hours. (Patient not taking: Reported on 06/19/2018) 10 capsule 0  . predniSONE (DELTASONE) 20 MG tablet Take 2 tablets (40 mg total) by mouth daily for 5 days. (Patient not taking: Reported on 06/19/2018) 10 tablet 0    OB/GYN Status:  No LMP for male patient.  General Assessment Data Location of Assessment: [facility](ICU) TTS Assessment: In system Is this a Tele or Face-to-Face Assessment?: Tele Assessment Is this an Initial Assessment or a Re-assessment for this encounter?: Initial Assessment Patient Accompanied by:: N/A Language Other than English: No Living Arrangements: Other (Comment)(With wife) What gender do you identify as?: Male Marital status: Married Pregnancy Status: No Living Arrangements: Spouse/significant other Can pt return to current living arrangement?: Yes Admission Status: Voluntary Is patient capable of signing voluntary admission?: Yes Referral Source: MD Insurance type: [facility]     Crisis Care Plan Living Arrangements: Spouse/significant other Name of Psychiatrist: None Name of Therapist: None  Education Status Is patient currently in school?: No Is the  patient employed, unemployed or receiving disability?: Unemployed(Retired)  Risk to self with the past 6 months Suicidal Ideation: Yes-Currently Present Has patient been a risk to self within the past 6 months prior to admission? : No Suicidal Intent: Yes-Currently Present(Pt says he wants to die.) Has patient had any suicidal intent within the past 6 months prior to admission? : No Is patient at risk for suicide?: Yes Suicidal Plan?: Yes-Currently Present Has patient had any suicidal plan within the past 6 months prior to admission? : No Specify Current Suicidal Plan: Not taking medications Access to Means: No What has been your use of drugs/alcohol within the last 12 months?: None Previous Attempts/Gestures: No How many times?: 0 Other Self Harm Risks: None Triggers for Past Attempts: None known Intentional Self Injurious Behavior: None Family Suicide History: No Recent stressful life event(s): Recent negative physical changes Persecutory voices/beliefs?: Yes Depression: No Depression Symptoms: (Pt denies depressive symptoms.) Substance abuse history and/or treatment for substance abuse?: No Suicide prevention information given to non-admitted  patients: Not applicable  Risk to Others within the past 6 months Homicidal Ideation: No Does patient have any lifetime risk of violence toward others beyond the six months prior to admission? : No Thoughts of Harm to Others: No Current Homicidal Intent: No Current Homicidal Plan: No Access to Homicidal Means: No Identified Victim: No one History of harm to others?: Yes Assessment of Violence: In distant past Violent Behavior Description: Combat veteran Does patient have access to weapons?: No Criminal Charges Pending?: No Does patient have a court date: No Is patient on probation?: No  Psychosis Hallucinations: None noted Delusions: Unspecified(Hyper religious)  Mental Status Report Appearance/Hygiene: Unremarkable, In hospital  gown Eye Contact: Fair Motor Activity: Freedom of movement, Unremarkable Speech: Pressured, Incoherent Level of Consciousness: Alert Mood: Anxious, Apprehensive, Irritable Affect: Irritable, Preoccupied Anxiety Level: Moderate Thought Processes: Irrelevant, Tangential Judgement: Impaired Orientation: Person, Place, Time, Situation Obsessive Compulsive Thoughts/Behaviors: Minimal  Cognitive Functioning Concentration: Normal Memory: Recent Impaired, Remote Intact Is patient IDD: No Insight: Poor Impulse Control: Fair Appetite: Poor Have you had any weight changes? : Loss Amount of the weight change? (lbs): (Pt unsure.  Says everything tastes like medicine.) Sleep: Decreased Total Hours of Sleep: (Up and down at night) Vegetative Symptoms: None  ADLScreening [facility] Assessment Services) Patient's cognitive ability adequate to safely complete daily activities?: Yes Patient able to express need for assistance with ADLs?: Yes Independently performs ADLs?: Yes (appropriate for developmental age)  Prior Inpatient Therapy Prior Inpatient Therapy: No  Prior Outpatient Therapy Prior Outpatient Therapy: No Does patient have an ACCT team?: No Does patient have Intensive In-House Services?  : No Does patient have [facility] services? : No Does patient have P4CC services?: No  ADL Screening (condition at time of admission) Patient's cognitive ability adequate to safely complete daily activities?: Yes Is the patient deaf or have difficulty hearing?: Yes Does the patient have difficulty seeing, even when wearing glasses/contacts?: Yes Does the patient have difficulty concentrating, remembering, or making decisions?: Yes Patient able to express need for assistance with ADLs?: Yes Does the patient have difficulty dressing or bathing?: No Independently performs ADLs?: Yes (appropriate for developmental age) Does the patient have difficulty walking or climbing stairs?: Yes Weakness of Legs:  Both(Uses a cane at times in the house.) Weakness of Arms/Hands: None  Home Assistive Devices/Equipment Home Assistive Devices/Equipment: Grab bars around toilet, Grab bars in shower, Shower chair with back, Eyeglasses, Dentures (specify type)  Therapy Consults (therapy consults require a physician order) PT Evaluation Needed: No OT Evalulation Needed: No SLP Evaluation Needed: No Abuse/Neglect Assessment (Assessment to be complete while patient is alone) Abuse/Neglect Assessment Can Be Completed: Yes Physical Abuse: Denies Verbal Abuse: Yes, past (Comment)(Past combat experience in [place] Sexual Abuse: Denies Exploitation of patient/patient's resources: Denies Self-Neglect: Denies Values / Beliefs Cultural Requests During Hospitalization: None Spiritual Requests During Hospitalization: None Consults Spiritual Care Consult Needed: No Social Work Consult Needed: No Merchant navy officerAdvance Directives (For Healthcare) Does Patient Have a Medical Advance Directive?: No Would patient like information on creating a medical advance directive?: No - Patient declined Nutrition Screen- [facility] Adult/[facility] Patient's home diet: Regular Has the patient recently lost weight without trying?: No Has the patient been eating poorly because of a decreased appetite?: No Malnutrition Screening Tool Score: 0        Disposition:  Disposition Initial Assessment Completed for this Encounter: Yes Patient referred to: Other (Comment)(Pt to be reviewed w/ PA)  This service was provided via telemedicine using a 2-way, interactive audio and video  technology.  Names of all persons participating in this telemedicine service and their role in this encounter. Name: Anthony Charles Role: patient  Name: Beatriz Stallion, M.S. LCAS QP Role: clinician  Name:  Role:   Name:  Role:     [place]and[place] Lodge 06/21/2018 8:10 PM

## 2018-06-21 NOTE — Progress Notes (Signed)
Pt asked for Anthony Anthony with his 2200 meds. He had ambien last night as well. Tonight patient is admitting to hallucinations. He states this is "wild" and has never experienced anything like this before. Reoriented patient. Bed alarm on

## 2018-06-21 NOTE — BH Assessment (Signed)
[facility] Assessment Progress Note   Clinician did contact Donell Sievert, PA.  He said that patient would need a psychiatric consult instead of teleassesment.  Daytime ICU doctor can place an order for psychiatric consult.    No disposition at this time.  Patient is not currently medically cleared.  Clinician informed nurse Fayrene Fearing of need for a psychiatric consult to be done tomorrow.

## 2018-06-21 NOTE — Progress Notes (Signed)
Patient Demographics:    Anthony Charles, is a 73 y.o. male, DOB - 07/11/1945, XBJ:[id number]  Admit date - 06/19/2018   Admitting Physician Briscoe Deutscherimothy S Opyd, MD  Outpatient [facility] the [facility]  LOS - 1   Chief Complaint  Patient presents with  . Shortness of Breath        Subjective:    Anthony Conferorman Matheny today has no fevers, no emesis,  No chest pain, patient is very emotional, tearful, verbalizing that he does not want to live anymore, he wants to be allowed to die,.... Phone call placed to patient's wife, apparently this is a normal occurrence for patient..... Patient's agitation from time to time patient will say that he is tired of being alive wants to die... She tells me this happens at least once a week--- telemetry psychiatric consult requested, safety sitter requested, may use lorazepam as needed agitation/anxiety  Assessment  & Plan :    Principal Problem:   Atrial fibrillation with RVR (HCC) Active Problems:   Acute respiratory failure with hypoxia (HCC)   CAD in native artery   Hypokalemia   Acute on chronic diastolic CHF (congestive heart failure) (HCC)   Presence of biventricular automatic implantable cardioverter defibrillator   Normocytic anemia   Hypothyroidism   Hyperbilirubinemia   Elevated troponin   COPD with acute exacerbation [facility](HCC)  Brief Summary 73 y.o. 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 Admitted for atrial fibrillation with RVR and CHF exacerbation.   Plan:- 1)PAFib--with RVR--- PTA Toprol 100 mg daily is on hold due to soft BP, per cardiology okay to  continue IV amiodarone, Cardizem drip discontinued,, cardiology plans to restart metoprolol 12.5 mg twice daily if BP allows, continue Eliquis 5 mg twice daily for stroke prophylaxis  2)Depression/Anxiety- patient is very emotional, tearful, verbalizing that he does not want to live anymore, he wants to be allowed to die,.... Phone call placed to patient's wife, apparently this is a normal occurrence for patient..... Patient's agitation from time to time patient will say that he is tired of being alive wants to die... She tells me this happens at least once a week--- telemetry psychiatric consult requested, safety sitter requested, may use lorazepam as needed agitation/anxiety  3)HFpEF--- acute on chronic diastolic CHF exacerbation secondary to A. fib with RVR--- patient is negative more than 3 L, weight is down over 5 pounds to about 125 pounds, will replace potassium, continue IV Lasix per cardiology at 20 mg twice daily, EF in the 50 to 55% range with moderate MR  4)COPD with mild exacerbation --- overall improved, on level albuterol scheduled doses and PRN duo nebs, Solu-Medrol 40 every 12 hours, possible switch to p.o. prednisone in a.m.  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 Tach/status post AICD placement----[place]last shocked 6 months agostatus post previous stenting 2016 and 2018, suspect secondary to demand ischemia in the  setting of A. fib with RVR, no ACS symptoms at this time  7) elevated LFTs--- suspect secondary to hepatic congestion in the setting of CHF exacerbation in the setting of A. fib with RVR... Consider holding statins if continues to increase  8) acute hypoxic respiratory failure--- try to wean off O2, hypoxic respiratory failure secondary to #1, #3 #4 above  Disposition/Need for in-Hospital Stay- patient unable to be discharged at this time due to  A. fib with RVR, CHF exacerbation and COPD exacerbation requiring supplemental oxygen, IV amiodarone and sepsis UTI due to depression with possible suicidal ideation  Code Status : Full code  Family Communication:   Wife by phone   Disposition Plan  : home  Consults  : Cardiology  DVT Prophylaxis  : Eliquis Lab Results  Component Value Date   PLT 267 06/21/2018    Inpatient Medications  Scheduled Meds: . apixaban  5 mg Oral BID  . atorvastatin  20 mg Oral q1800  . budesonide (PULMICORT) nebulizer solution  0.5 mg Nebulization BID  . dextromethorphan-guaiFENesin  1 tablet Oral BID  . furosemide  20 mg Intravenous BID  . ipratropium  0.5 mg Nebulization Q6H  . levalbuterol  0.63 mg Nebulization Q6H  . levothyroxine  112 mcg Oral Q0600  . methylPREDNISolone (SOLU-MEDROL) injection  40 mg Intravenous Q12H  . metoprolol tartrate  12.5 mg Oral BID  . potassium chloride  20 mEq Oral BID  . sodium chloride flush  3 mL Intravenous Q12H   Continuous Infusions: . sodium chloride    . amiodarone 60 mg/hr (06/21/18 1500)  . amiodarone    . magnesium sulfate 1 - 4 g bolus IVPB     PRN Meds:.sodium chloride, acetaminophen, ipratropium-albuterol, ondansetron (ZOFRAN) IV, sodium chloride flush, traZODone    Anti-infectives (From admission, onward)   None        Objective:   Vitals:   06/21/18 1445 06/21/18 1500 06/21/18 1653 06/21/18 1700  BP: 115/83 (!) 86/61  119/69  Pulse: (!) 125 97 (!) 109 (!) 109  Resp: 20 (!) 24 (!) 25 (!) 27  Temp:   98.5 F (36.9 C)   TempSrc:   Oral   SpO2: 95% 95% (!) 88% (!) 88%  Weight:      Height:        Wt Readings from Last 3 Encounters:  06/21/18 57.1 kg  06/16/18 64 kg     Intake/Output Summary (Last 24 hours) at 06/21/2018 1814 Last data filed at 06/21/2018 1500 Gross per 24 hour  Intake 176.68 ml  Output 1400 ml  Net -1223.32 ml     Physical Exam Patient is examined daily including today on 06/21/18 , exams remain the  same as of yesterday except that has changed   Gen:- Awake Alert, emotional, tearful at times HEENT:- [place].AT, No sclera icterus Nose-  2 L/min Neck-Supple Neck,No JVD,.  Lungs-diminished breath sounds with few scattered wheezes, few bibasilar Rales,  CV- S1, S2 normal, irregularly irregular, left subclavian area with AICD in situ  abd-  +ve B.Sounds, Abd Soft, No tenderness,    Extremity/Skin:- No  edema, pedal pulses present  Psych-affect is anxious/depressed oriented x3 Neuro-no new focal deficits, no tremors   Data Review:   Micro Results Recent Results (from the past 240 hour(s))  Blood culture (routine x 2)     Status: None   Collection Time: 06/13/18  1:20 PM  Result Value Ref Range Status   Specimen Description RIGHT ANTECUBITAL  Final  Special Requests   Final    BOTTLES DRAWN AEROBIC AND ANAEROBIC Blood Culture adequate volume   Culture   Final    NO GROWTH 5 DAYS Performed at [facility], [address], [place], [place] [address]    Report Status 06/18/2018 FINAL  Final  Blood culture (routine x 2)     Status: None   Collection Time: 06/13/18  1:45 PM  Result Value Ref Range Status   Specimen Description LEFT ANTECUBITAL  Final   Special Requests   Final    BOTTLES DRAWN AEROBIC AND ANAEROBIC Blood Culture adequate volume   Culture   Final    NO GROWTH 5 DAYS Performed at [facility], [address] [place], [place] [address]    Report Status 06/18/2018 FINAL  Final  MRSA PCR Screening     Status: None   Collection Time: 06/13/18  9:37 PM  Result Value Ref Range Status   MRSA by PCR NEGATIVE NEGATIVE Final    Comment:        The GeneXpert MRSA Assay (FDA approved for NASAL specimens only), is one component of a comprehensive MRSA colonization surveillance program. It is not intended to diagnose MRSA infection nor to guide or monitor treatment for MRSA infections. Performed at [facility], [address] [place], [place] [address]   Blood culture  (routine x 2)     Status: None (Preliminary result)   Collection Time: 06/19/18  7:32 PM  Result Value Ref Range Status   Specimen Description BLOOD RIGHT FOREARM  Final   Special Requests   Final    BOTTLES DRAWN AEROBIC AND ANAEROBIC Blood Culture adequate volume   Culture   Final    NO GROWTH 2 DAYS Performed at [facility], [address], [place], [place] [address]    Report Status PENDING  Incomplete  Blood culture (routine x 2)     Status: None (Preliminary result)   Collection Time: 06/19/18  7:37 PM  Result Value Ref Range Status   Specimen Description LEFT ANTECUBITAL  Final   Special Requests   Final    BOTTLES DRAWN AEROBIC AND ANAEROBIC Blood Culture adequate volume   Culture   Final    NO GROWTH 2 DAYS Performed at [facility], [address], [place], [place] [address]    Report Status PENDING  Incomplete    Radiology Reports Dg Chest 2 View  Result Date: 06/13/2018 CLINICAL DATA:  Weakness, body aches, cough EXAM: CHEST - 2 VIEW COMPARISON:  None. FINDINGS: Pulmonary hyperinflation and probable emphysematous change. No acute abnormality of the lungs. Left chest multi lead pacer defibrillator. Cardiomegaly. Disc degenerative disease of the thoracic spine. IMPRESSION: Pulmonary hyperinflation and probable emphysematous change. No acute appearing airspace opacity. Cardiomegaly. Electronically Signed   By: Lauralyn Primes M.D.   On: 06/13/2018 14:45   Dg Pelvis Portable  Result Date: 06/21/2018 CLINICAL DATA:  Hip abrasion, no known injury EXAM: PORTABLE PELVIS 1-2 VIEWS COMPARISON:  None. FINDINGS: There is no evidence of pelvic fracture or diastasis. No pelvic bone lesions are seen. IMPRESSION: Negative. Electronically Signed   By: Marlan Palau M.D.   On: 06/21/2018 14:43   Dg Chest Port 1 View  Result Date: 06/19/2018 CLINICAL DATA:  Cough, shortness of breath and chest pain. EXAM: PORTABLE CHEST 1 VIEW COMPARISON:  06/13/2018 FINDINGS: Stable heart size and  appearance of dual-chamber pacemaker. Since the prior study, there has been development of mild pulmonary edema. No pleural effusions or focal airspace  consolidation. No pneumothorax. Stable chronic lung disease. IMPRESSION: Development of mild pulmonary edema. Electronically Signed   By: Irish Lack M.D.   On: 06/19/2018 20:30     CBC Recent Labs  Lab 06/15/18 0413 06/19/18 1932 06/20/18 0611 06/21/18 1539  WBC 6.0 12.4* 11.1* 17.8*  HGB 9.5* 9.5* 8.8* 8.9*  HCT 29.9* 29.3* 27.3* 27.9*  PLT 134* 214 210 267  MCV 101.7* 98.0 97.8 99.6  MCH 32.3 31.8 31.5 31.8  MCHC 31.8 32.4 32.2 31.9  RDW 13.4 14.0 13.7 13.7  LYMPHSABS  --  0.9 1.1  --   MONOABS  --  0.7 0.8  --   EOSABS  --  0.0 0.0  --   BASOSABS  --  0.0 0.0  --     Chemistries  Recent Labs  Lab 06/15/18 0413 06/19/18 1932 06/20/18 0611 06/21/18 0513  NA 143 141 140 141  K 4.2 3.1* 3.4* 3.1*  CL 119* 111 109 106  CO2 19* 19* 23 27  GLUCOSE 128* 115* 110* 178*  BUN 25* 27* 24* 25*  CREATININE 1.02 0.91 0.96 0.90  CALCIUM 7.5* 8.0* 7.7* 7.8*  MG  --   --  2.1  --   AST 57* 42* 33 28  ALT 26 41 34 29  ALKPHOS 76 78 68 70  BILITOT 0.6 2.0* 1.8* 1.1   ------------------------------------------------------------------------------------------------------------------ No results for input(s): CHOL, HDL, LDLCALC, TRIG, CHOLHDL, LDLDIRECT in the last 72 hours.  No results found for: HGBA1C ------------------------------------------------------------------------------------------------------------------ Recent Labs    06/19/18 1932  TSH 1.173   ------------------------------------------------------------------------------------------------------------------ No results for input(s): VITAMINB12, FOLATE, FERRITIN, TIBC, IRON, RETICCTPCT in the last 72 hours.  Coagulation profile No results for input(s): INR, PROTIME in the last 168 hours.  No results for input(s): DDIMER in the last 72 hours.  Cardiac Enzymes  Recent Labs  Lab 06/19/18 1932 06/20/18 0016 06/20/18 0611  TROPONINI 0.05* 0.05* 0.04*   ------------------------------------------------------------------------------------------------------------------    Component Value Date/Time   BNP 1,319.0 (H) 06/19/2018 1932     Shon Hale M.D on 06/21/2018 at 6:14 PM  Go to [email or web address] - for contact info  [facility] - Office  [phone]

## 2018-06-21 NOTE — Progress Notes (Signed)
Went in to check on pt and give evening meds. Pt verbally stated "I don't want no more medicine. I don't want to eat. I don't want no visitors. I don't want no phone calls. I want to die. Please let me die." Attempted to comfort pt. Pt stated his wife does not want him back home. Repeatedly stating "Please let me die. I can't do this anymore. Dr. Mariea Clonts paged and Charge nurse notified. Attempted to call Chaplain.

## 2018-06-21 NOTE — Progress Notes (Addendum)
Progress Note  Patient Name: Anthony Charles Date of Encounter: 06/21/2018  Primary Cardiologist: Followed by Dr. Alcario Drought in [place], [place]  Subjective   Breathing improved but not at baseline. No chest pain or palpitations. Anxious to go home as his wife is there and has the flu.   Inpatient Medications    Scheduled Meds:  amiodarone  100 mg Oral Daily   apixaban  5 mg Oral BID   atorvastatin  20 mg Oral q1800   budesonide (PULMICORT) nebulizer solution  0.5 mg Nebulization BID   dextromethorphan-guaiFENesin  1 tablet Oral BID   furosemide  20 mg Intravenous BID   ipratropium  0.5 mg Nebulization Q6H   levalbuterol  0.63 mg Nebulization Q6H   levothyroxine  112 mcg Oral Q0600   methylPREDNISolone (SOLU-MEDROL) injection  40 mg Intravenous Q12H   metoprolol tartrate  12.5 mg Oral BID   potassium chloride  20 mEq Oral BID   sodium chloride flush  3 mL Intravenous Q12H   Continuous Infusions:  sodium chloride     diltiazem (CARDIZEM) infusion 5 mg/hr (06/20/18 2206)   magnesium sulfate 1 - 4 g bolus IVPB     PRN Meds: sodium chloride, acetaminophen, ipratropium-albuterol, ondansetron (ZOFRAN) IV, sodium chloride flush, traZODone   Vital Signs    Vitals:   06/21/18 0400 06/21/18 0500 06/21/18 0723 06/21/18 0745  BP: (!) 83/60 (!) 85/71    Pulse: (!) 59 (!) 108 99   Resp: (!) 33 20 (!) 26   Temp: 98.3 F (36.8 C)  98.1 F (36.7 C)   TempSrc: Oral  Oral   SpO2: 92% 95% 97% 96%  Weight:  57.1 kg    Height:        Intake/Output Summary (Last 24 hours) at 06/21/2018 0841 Last data filed at 06/21/2018 0500 Gross per 24 hour  Intake 52.42 ml  Output 2300 ml  Net -2247.58 ml    Last 3 Weights 06/21/2018 06/20/2018 06/20/2018  Weight (lbs) 125 lb 14.1 oz 130 lb 1.1 oz 130 lb 1.1 oz  Weight (kg) 57.1 kg 59 kg 59 kg      Telemetry    Atrial fibrillation, HR mostly in 90's to low-100's, peaking into 120's at times. Frequent PVC's and intermittent  V-pacing.  - Personally Reviewed  ECG    No new tracings.   Physical Exam   General: Well developed, thin Caucasian male appearing in no acute distress. Head: Normocephalic, atraumatic.  Neck: Supple without bruits, JVD at 9cm. Lungs:  Resp regular and unlabored, decreased along bases with rhonchi throughout. Heart: Irregularly irregular, S1, S2, no S3, S4, or murmur; no rub. Abdomen: Soft, non-tender, non-distended with normoactive bowel sounds. No hepatomegaly. No rebound/guarding. No obvious abdominal masses. Extremities: No clubbing, cyanosis, or lower extremity edema. Distal pedal pulses are 2+ bilaterally. Neuro: Alert and oriented X 3. Moves all extremities spontaneously. Psych: Normal affect.  Labs    Chemistry Recent Labs  Lab 06/19/18 1932 06/20/18 0611 06/21/18 0513  NA 141 140 141  K 3.1* 3.4* 3.1*  CL 111 109 106  CO2 19* 23 27  GLUCOSE 115* 110* 178*  BUN 27* 24* 25*  CREATININE 0.91 0.96 0.90  CALCIUM 8.0* 7.7* 7.8*  PROT 5.9* 5.3* 5.4*  ALBUMIN 3.1* 2.7* 2.7*  AST 42* 33 28  ALT 41 34 29  ALKPHOS 78 68 70  BILITOT 2.0* 1.8* 1.1  GFRNONAA >60 >60 >60  GFRAA >60 >60 >60  ANIONGAP 11 8 8  Hematology Recent Labs  Lab 06/15/18 0413 06/19/18 1932 06/20/18 0611  WBC 6.0 12.4* 11.1*  RBC 2.94* 2.99* 2.79*  HGB 9.5* 9.5* 8.8*  HCT 29.9* 29.3* 27.3*  MCV 101.7* 98.0 97.8  MCH 32.3 31.8 31.5  MCHC 31.8 32.4 32.2  RDW 13.4 14.0 13.7  PLT 134* 214 210    Cardiac Enzymes Recent Labs  Lab 06/19/18 1932 06/20/18 0016 06/20/18 0611  TROPONINI 0.05* 0.05* 0.04*   No results for input(s): TROPIPOC in the last 168 hours.   BNP Recent Labs  Lab 06/19/18 1932  BNP 1,319.0*     DDimer No results for input(s): DDIMER in the last 168 hours.   Radiology    Dg Chest Port 1 View  Result Date: 06/19/2018 CLINICAL DATA:  Cough, shortness of breath and chest pain. EXAM: PORTABLE CHEST 1 VIEW COMPARISON:  06/13/2018 FINDINGS: Stable heart size  and appearance of dual-chamber pacemaker. Since the prior study, there has been development of mild pulmonary edema. No pleural effusions or focal airspace consolidation. No pneumothorax. Stable chronic lung disease. IMPRESSION: Development of mild pulmonary edema. Electronically Signed   By: Irish Lack M.D.   On: 06/19/2018 20:30    Cardiac Studies   Echocardiogram: 06/20/2018 IMPRESSIONS   1. Stage 1: stage1: Basal inferior segment is abnormal.  2. The left ventricle has low normal systolic function, with an ejection fraction of 50-55%. The cavity size was normal. Left ventricular diastolic function could not be evaluated secondary to atrial fibrillation. Indeterminate filling pressures.  3. The right ventricle has normal systolic function. The cavity was normal. There is no increase in right ventricular wall thickness.  4. Left atrial size was moderately dilated.  5. Right atrial size was mildly dilated.  6. The mitral valve is abnormal. Mild thickening of the mitral valve leaflet. Mild calcification of the mitral valve leaflet. There is moderate mitral annular calcification present. Mitral valve regurgitation is moderate by color flow Doppler. The MR  jet is eccentric.  7. The tricuspid valve is grossly normal.  8. The aortic valve is abnormal Mild thickening of the aortic valve Mild calcification of the aortic valve.  9. Morphologically, there appears to be at least mild calcific aortic stenosis. 10. The aortic root is normal in size and structure. 11. Pulmonary hypertension is mild. 12. The inferior vena cava was dilated in size with <50% respiratory variability.  FINDINGS  Left Ventricle: The left ventricle has low normal systolic function, with an ejection fraction of 50-55%. The cavity size was normal. There is no increase in left ventricular wall thickness. Left ventricular diastolic function could not be evaluated  secondary to atrial fibrillation. Indeterminate filling  pressures  The basal inferior segment is hypokinetic.  Patient Profile     73 y.o. male w/ PMH of CAD (s/p prior stenting in 2016 and 2018 per patient's report), history of PPM/ICD for presumed VT per patient's history, paroxysmal atrial fibrillation (on Eliquis and Amiodarone), Hypothyroidism, HTN, HLD, COPD and Agent Orange Exposure who presented to [facility] on 06/19/2018 for evaluation of worsening dyspnea. Admitted for atrial fibrillation with RVR and CHF exacerbation.   Assessment & Plan    1. Paroxysmal Atrial Fibrillation - rates have improved with the use of IV Cardizem and are in the 90's to 110's on IV Cardizem at 5 mg/hr. On Toprol-XL 100 mg daily PTA but currently held due to hypotension.  - has been continued on PTA Amiodarone  daily (by patient's history I suspect he had been  on this for sustained VT per his report as he says they "started the medication to keep his device from firing so frequently"). He just received PO Lopressor 12.5mg  and Amiodarone. Will reassess HR following this and can hopefully stop Cardizem drip and titrate Lopressor to 25mg  BID if BP allows.  If not, would consider temporary increase of Amiodarone. Will discuss with Dr. Tenny Craw as well.  - Continue Eliquis 5mg  BID for anticoagulation.   2. Acute on Chronic Diastolic CHF Exacerbation  - BNP elevated to 1319 on admission and CXR shows mild pulmonary edema. He was started on IV Lasix 20mg  BID and has diuresed -3.1L thus far and weight has declined by 5 lbs to 125 lbs (Patient unsure of baseline weight). Creatinine remains stable at 0.90 this AM but hypokalemic at 3.1. K+ supplementation already ordered. Would continue with IV Lasix today and possible switch to PO tomorrow.   3. CAD/ Presumed Prior VT - Reports prior cardiac stenting in 2016 and 2018. He has experienced worsening dyspnea but denies any chest pain. Cyclic troponin values have been flat at 0.05, 0.05, and 0.04 this admission which is most  consistent with demand ischemia and not ACS. Unable to interrogate his device as these services are limited due to no device representatives currently allowed in the hospital. He denies any recent ICD firings.  - repeat echo this admission shows EF of 50-55% with abnormal basal inferior segment, moderate MR, and mild AS. Have requested outside records. - continue Amiodarone 100mg  daily, BB, and statin therapy. Not on ASA given the need for anticoagulation.   4. HTN - BP has been soft at 75/41 - 120/91 within the past 24 hours. On Toprol-XL 100mg  daily PTA. Currently held given hypotension.  - on Cardizem CD 5mg /hr and Lopressor 12.5mg  BID. Will plan to titrate Lopressor as outlined above if BP allows.    5. HLD - has been continued on PTA Atorvastatin 20mg  daily. Will add FLP to AM labs.   6. COPD - he has been started on scheduled nebulizers and IV steroids.  - Further treatment per admitting team.   Have faxed records request over to Dr. Alcario Drought [facility] # [phone]) today. Hopefully these will be available tomorrow.    For questions or updates, please contact [facility] Please consult [email or web address] for contact info under Cardiology/STEMI.   Signed, Ellsworth Lennox , PA-C 8:41 AM 06/21/2018 Pager: [phone]  Patient seen and examined   I agree with findings as noted by B Strader above  PT is a frail 73 yo in NAD   Pt denies CP   Breathing is improved some  Lungs with rhonchi, decreased airflow Cardiac  Irreg irreg   No S3 Abd  / LE    Bruising of R thigh wrapping around to back   No swelling   Ext with Tr edema   REmains in atrial fibrillation with heart rate not optimally controlled Would start on IV amiodarone with no bolus to load   B Blocker on hold with hypotension   May do better with bid dosing Diuresing    Agree with Xray of pelvis to look for Fx.   Dietrich Pates

## 2018-06-21 NOTE — Progress Notes (Addendum)
Spoke with Anthony Charles again. He confirms he does not want to take any medications. When asked if he was trying to hurt himself, Anthony Charles states "It's my time. I was in the [place] war and it's my time." Explained to Anthony Charles what could/would happen if we were to stop his medications. Anthony Charles expressed understanding and stated once again that he wants to die and stated he does not want to talk to a chaplain.

## 2018-06-21 NOTE — Progress Notes (Signed)
Patient refusing all night medications. States he no longer wishes to take any medications and it is "my time to go." Denies suicidal thoughts or attempts. Will continue to monitor.

## 2018-06-22 LAB — LIPID PANEL
Cholesterol: 90 mg/dL (ref 0–200)
HDL: 28 mg/dL — ABNORMAL LOW (ref >40)
LDL Cholesterol: 42 mg/dL (ref 0–99)
Total CHOL/HDL Ratio: 3.2 RATIO
Triglycerides: 99 mg/dL (ref <150)
VLDL: 20 mg/dL (ref 0–40)

## 2018-06-22 MED ORDER — TRAZODONE HCL 50 MG PO TABS
100.0000 mg | ORAL_TABLET | Freq: Every day | ORAL | Status: DC
  Administered 2018-06-22 – 2018-06-24 (×3): 100 mg via ORAL
  Filled 2018-06-22 (×3): qty 2

## 2018-06-22 MED ORDER — SODIUM CHLORIDE 0.9 % IV BOLUS
250.0000 mL | Freq: Once | INTRAVENOUS | Status: AC
  Administered 2018-06-22: 250 mL via INTRAVENOUS

## 2018-06-22 MED ORDER — IPRATROPIUM BROMIDE 0.02 % IN SOLN: 0.5000 mg | Freq: Two times a day (BID) | RESPIRATORY_TRACT | Status: DC

## 2018-06-22 MED ORDER — PREDNISONE 20 MG PO TABS
40.0000 mg | ORAL_TABLET | Freq: Every day | ORAL | Status: DC
  Administered 2018-06-23: 40 mg via ORAL
  Filled 2018-06-22: qty 2

## 2018-06-22 MED ORDER — ALPRAZOLAM 0.25 MG PO TABS: 0.2500 mg | ORAL_TABLET | Freq: Three times a day (TID) | ORAL | Status: DC | PRN

## 2018-06-22 MED ORDER — LEVALBUTEROL HCL 0.63 MG/3ML IN NEBU
0.6300 mg | INHALATION_SOLUTION | Freq: Three times a day (TID) | RESPIRATORY_TRACT | Status: DC | PRN
  Administered 2018-06-24: 0.63 mg via RESPIRATORY_TRACT
  Filled 2018-06-22: qty 3

## 2018-06-22 MED ORDER — LEVALBUTEROL HCL 0.63 MG/3ML IN NEBU: 0.6300 mg | INHALATION_SOLUTION | Freq: Two times a day (BID) | RESPIRATORY_TRACT | Status: DC

## 2018-06-22 MED ORDER — IPRATROPIUM BROMIDE 0.02 % IN SOLN
0.5000 mg | Freq: Three times a day (TID) | RESPIRATORY_TRACT | Status: DC
  Administered 2018-06-22 (×2): 0.5 mg via RESPIRATORY_TRACT
  Filled 2018-06-22 (×2): qty 2.5

## 2018-06-22 NOTE — Progress Notes (Signed)
Patient continues to refuse medications, denies suicidal ideation, states "I do not want to kill myself, I just want to die a comfortable death."

## 2018-06-22 NOTE — Progress Notes (Signed)
Patient's BP low on multiple checks, patient asymptomatic. Mid level aware, orders placed and followed. Will continue to monitor.

## 2018-06-22 NOTE — Progress Notes (Signed)
Patient Demographics:    Anthony Charles, is a 73 y.o. male, DOB - [birth year], VEH:[id number]  Admit date - 06/19/2018   Admitting Physician Briscoe Deutscher, MD  [facility]patient [facility] the [facility]  LOS - 2   Chief Complaint  Patient presents with  . Shortness of Breath        Subjective:    Anthony Charles today has no fevers, no emesis,  No chest pain, patient is very emotional, tearful, verbalizing that he does not want to live anymore, he wants to be allowed to die,.... Phone call placed to patient's wife, apparently this is a normal occurrence for patient..... Patient's agitation from time to time patient will say that he is tired of being alive wants to die... 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 Admitted for atrial fibrillation with RVR and CHF exacerbation.   Plan:- 1)PAFib--with RVR--- rate control remains challenging, patient remains tachycardic, PTA Toprol 100  mg daily is on hold due to soft BP, per cardiology okay to continue IV amiodarone, Cardizem drip discontinued,, c/n  metoprolol 12.5 mg twice daily if BP allows, continue Eliquis 5 mg twice daily for stroke prophylaxis, plan to switch him to p.o. amiodarone 20 mg twice daily probably on 06/23/2018  2)Depression/Anxiety-behavioral health assessment appreciated, okay to use Xanax as needed during the day and lorazepam IV nightly as needed may start trazodone 100 mg nightly scheduled.  No suicidal or homicidal ideation or plan at this time  3)HFpEF--- acute on chronic diastolic CHF exacerbation secondary to A. fib with RVR--- patient is negative more than 3 L, weight is down over 5 pounds to about 125 pounds,  per cardiology continue Lasix at 20 mg twice daily, EF in the 50 to 55% range with moderate MR  4)COPD with mild exacerbation --- overall improved, de-escalate bronchodilators due to persistent tachycardia, treated with Solu-Medrol,  switch to p.o. prednisone 40 mg daily in a.m.  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 Tach/status post AICD placement----AICD was  placed in [place], [place], -last shocked 6 months agostatus post previous stenting 2016 and 2018, suspect secondary to demand ischemia in the setting of A. fib with RVR, no ACS symptoms at this time  7)Elevated LFTs--- suspect secondary to hepatic congestion in the setting of CHF exacerbation in  the setting of A. fib with RVR... Consider holding statins if continues to increase  8) acute hypoxic respiratory failure--- tnow resolved, patient is off oxygen,  hypoxic respiratory failure was secondary to #1, #3 #4 above  Disposition/Need for in-Hospital Stay- patient unable to be discharged at this time due to A. fib with RVR, rate control remains very challenging despite IV amiodarone, CHF exacerbation and COPD exacerbation requiring supplemental  oxygen, IV amiodarone  Code Status : Full code  Family Communication:   Wife by phone   Disposition Plan  : home  Consults  : Cardiology  DVT Prophylaxis  : Eliquis Lab Results  Component Value Date   PLT 267 06/21/2018    Inpatient Medications  Scheduled Meds: . apixaban  5 mg Oral BID  . atorvastatin  20 mg Oral q1800  . budesonide (PULMICORT) nebulizer solution  0.5 mg Nebulization BID  . dextromethorphan-guaiFENesin  1 tablet Oral BID  . furosemide  20 mg Intravenous BID  . [START ON 06/23/2018] ipratropium  0.5 mg Nebulization BID  . [START ON 06/23/2018] levalbuterol  0.63 mg Nebulization BID  . levothyroxine  112 mcg Oral Q0600  . methylPREDNISolone (SOLU-MEDROL) injection  40 mg Intravenous Q12H  . metoprolol tartrate  12.5 mg Oral BID  . potassium chloride  20 mEq Oral BID  . sodium chloride flush  3 mL Intravenous Q12H   Continuous Infusions: . sodium chloride    . amiodarone 60 mg/hr (06/22/18 1812)  . magnesium sulfate 1 - 4 g bolus IVPB     PRN Meds:.sodium chloride, acetaminophen, ipratropium-albuterol, LORazepam, ondansetron (ZOFRAN) IV, sodium chloride flush, traZODone    Anti-infectives (From admission, onward)   None        Objective:   Vitals:   06/22/18 0900 06/22/18 0905 06/22/18 1130 06/22/18 1424  BP:  106/66    Pulse: (!) 106 (!) 127    Resp:      Temp:   97.9 F (36.6 C)   TempSrc:   Axillary   SpO2: 90%   93%  Weight:      Height:        Wt Readings from Last 3 Encounters:  06/22/18 56.3 kg  06/16/18 64 kg     Intake/Output Summary (Last 24 hours) at 06/22/2018 1837 Last data filed at 06/22/2018 1730 Gross per 24 hour  Intake 603 ml  Output 1500 ml  Net -897 ml    Physical Exam Patient is examined daily including today on 06/22/18 , exams remain the same as of yesterday except that has changed   Gen:- Awake Alert, more relaxed HEENT:- [place].AT, No sclera icterus Neck-Supple Neck,No JVD,.  Lungs-diminished breath  sounds , no wheezing  CV- S1, S2 normal, irregularly irregular, left subclavian area with AICD in situ  abd-  +ve B.Sounds, Abd Soft, No tenderness,    Extremity/Skin:- No  edema, pedal pulses present  Psych-affect is anxious/depressed oriented x3 Neuro-no new focal deficits, no tremors   Data Review:   Micro Results Recent Results (from the past 240 hour(s))  Blood culture (routine x 2)     Status: None   Collection Time: 06/13/18  1:20 PM  Result Value Ref Range Status   Specimen Description RIGHT ANTECUBITAL  Final   Special Requests   Final    BOTTLES DRAWN AEROBIC AND ANAEROBIC Blood Culture adequate volume   Culture   Final    NO GROWTH 5 DAYS Performed at [facility], [address] [place], [place] [address]  Report Status 06/18/2018 FINAL  Final  Blood culture (routine x 2)     Status: None   Collection Time: 06/13/18  1:45 PM  Result Value Ref Range Status   Specimen Description LEFT ANTECUBITAL  Final   Special Requests   Final    BOTTLES DRAWN AEROBIC AND ANAEROBIC Blood Culture adequate volume   Culture   Final    NO GROWTH 5 DAYS Performed at [facility], [address] [place], [place] [address]    Report Status 06/18/2018 FINAL  Final  MRSA PCR Screening     Status: None   Collection Time: 06/13/18  9:37 PM  Result Value Ref Range Status   MRSA by PCR NEGATIVE NEGATIVE Final    Comment:        The GeneXpert MRSA Assay (FDA approved for NASAL specimens only), is one component of a comprehensive MRSA colonization surveillance program. It is not intended to diagnose MRSA infection nor to guide or monitor treatment for MRSA infections. Performed at [facility], [address] [place], [place] [address]   Blood culture (routine x 2)     Status: None (Preliminary result)   Collection Time: 06/19/18  7:32 PM  Result Value Ref Range Status   Specimen Description BLOOD RIGHT FOREARM  Final   Special Requests   Final    BOTTLES DRAWN AEROBIC AND  ANAEROBIC Blood Culture adequate volume   Culture   Final    NO GROWTH 3 DAYS Performed at [facility], [address] [place], [place] [address]    Report Status PENDING  Incomplete  Blood culture (routine x 2)     Status: None (Preliminary result)   Collection Time: 06/19/18  7:37 PM  Result Value Ref Range Status   Specimen Description LEFT ANTECUBITAL  Final   Special Requests   Final    BOTTLES DRAWN AEROBIC AND ANAEROBIC Blood Culture adequate volume   Culture   Final    NO GROWTH 3 DAYS Performed at [facility], [address] [place], [place] [address]    Report Status PENDING  Incomplete    Radiology Reports Dg Chest 2 View  Result Date: 06/13/2018 CLINICAL DATA:  Weakness, body aches, cough EXAM: CHEST - 2 VIEW COMPARISON:  None. FINDINGS: Pulmonary hyperinflation and probable emphysematous change. No acute abnormality of the lungs. Left chest multi lead pacer defibrillator. Cardiomegaly. Disc degenerative disease of the thoracic spine. IMPRESSION: Pulmonary hyperinflation and probable emphysematous change. No acute appearing airspace opacity. Cardiomegaly. Electronically Signed   By: Lauralyn PrimesAlex  Bibbey M.D.   On: 06/13/2018 14:45   Dg Pelvis Portable  Result Date: 06/21/2018 CLINICAL DATA:  Hip abrasion, no known injury EXAM: PORTABLE PELVIS 1-2 VIEWS COMPARISON:  None. FINDINGS: There is no evidence of pelvic fracture or diastasis. No pelvic bone lesions are seen. IMPRESSION: Negative. Electronically Signed   By: Marlan Palauharles  Clark M.D.   On: 06/21/2018 14:43   Dg Chest Port 1 View  Result Date: 06/19/2018 CLINICAL DATA:  Cough, shortness of breath and chest pain. EXAM: PORTABLE CHEST 1 VIEW COMPARISON:  06/13/2018 FINDINGS: Stable heart size and appearance of dual-chamber pacemaker. Since the prior study, there has been development of mild pulmonary edema. No pleural effusions or focal airspace consolidation. No pneumothorax. Stable chronic lung disease. IMPRESSION: Development of  mild pulmonary edema. Electronically Signed   By: Irish LackGlenn  Yamagata M.D.   On: 06/19/2018 20:30     CBC Recent Labs  Lab 06/19/18 1932 06/20/18 0611 06/21/18 1539  WBC  12.4* 11.1* 17.8*  HGB 9.5* 8.8* 8.9*  HCT 29.3* 27.3* 27.9*  PLT 214 210 267  MCV 98.0 97.8 99.6  MCH 31.8 31.5 31.8  MCHC 32.4 32.2 31.9  RDW 14.0 13.7 13.7  LYMPHSABS 0.9 1.1  --   MONOABS 0.7 0.8  --   EOSABS 0.0 0.0  --   BASOSABS 0.0 0.0  --     Chemistries  Recent Labs  Lab 06/19/18 1932 06/20/18 0611 06/21/18 0513  NA 141 140 141  K 3.1* 3.4* 3.1*  CL 111 109 106  CO2 19* 23 27  GLUCOSE 115* 110* 178*  BUN 27* 24* 25*  CREATININE 0.91 0.96 0.90  CALCIUM 8.0* 7.7* 7.8*  MG  --  2.1  --   AST 42* 33 28  ALT 41 34 29  ALKPHOS 78 68 70  BILITOT 2.0* 1.8* 1.1   ------------------------------------------------------------------------------------------------------------------ Recent Labs    06/22/18 0441  CHOL 90  HDL 28*  LDLCALC 42  TRIG 99  CHOLHDL 3.2    No results found for: HGBA1C ------------------------------------------------------------------------------------------------------------------ Recent Labs    06/19/18 1932  TSH 1.173   ------------------------------------------------------------------------------------------------------------------ No results for input(s): VITAMINB12, FOLATE, FERRITIN, TIBC, IRON, RETICCTPCT in the last 72 hours.  Coagulation profile No results for input(s): INR, PROTIME in the last 168 hours.  No results for input(s): DDIMER in the last 72 hours.  Cardiac Enzymes Recent Labs  Lab 06/19/18 1932 06/20/18 0016 06/20/18 0611  TROPONINI 0.05* 0.05* 0.04*   ------------------------------------------------------------------------------------------------------------------    Component Value Date/Time   BNP 1,319.0 (H) 06/19/2018 1932     Shon Hale M.D on 06/22/2018 at 6:37 PM  Go to [email or web address] - for contact info  [facility] - Office  [phone]

## 2018-06-22 NOTE — Progress Notes (Signed)
Palliative Medicine consult order noted. PMT provider is scheduled to return to [facility] on June 25, 2018. If the patient remains hospitalized, the consult will be evaluated at that time. If recommendations are needed in the interim, please call our office at [phone].   Crewe Heathman G. Grantham Hippert, RN, BSN, CHPN Palliative Medicine Team 06/22/2018 9:32 AM Office [phone] 

## 2018-06-22 NOTE — Care Management Important Message (Signed)
Important Message  Patient Details  Name: Anthony Charles MRN: [id number] Date of Birth: 01/18/1946   Medicare Important Message Given:  Yes    Elliot Gault, LCSW 06/22/2018, 10:22 AM

## 2018-06-22 NOTE — Progress Notes (Addendum)
   Chart reviewed. Patient has been intermittently refusing all medications and saying he is "tired of living and ready to die". Palliative Care has been consulted for Goals of Care. Rates have been variable in the 70's to 140's at times given he did not take his BB last night but was in agreement to taking this morning. Would continue with IV Amiodarone for now. He was on 100mg  daily PTA and may require a higher dose of 200mg  BID upon discharge which can be decreased over time. In regards to Lopressor, would plan to titrate to 25mg  BID tomorrow if rates remain elevated and BP allows.   Signed, Ellsworth Lennox, PA-C 06/22/2018, 11:01 AM Pager: [phone]  Agree with assessment as noted above  Would recomm amiodarone po bid for heart rate control   Cannot force pt to take meds.    Will sign off   Please call with questions.  Dietrich Pates

## 2018-06-22 NOTE — Progress Notes (Signed)
Patient is seen by me via tele-psych and I consulted with Dr. Lucianne Muss.  Patient denies any suicidal homicidal ideations and denies any hallucinations.  Patient reports that he feels much better today and he was actually able to sleep well last night.  He states that sometimes he gets overwhelmed with his medical issues, his past, and the amount of medications that he has to take.  He states that he feels that he is ready to go home and feels much better.  He also reports that he has another caregiver that comes to see him who is in [place] war [place] by the name of Anthony Charles.  Patient gives me verbal permission to contact his wife.  I contacted patient's wife, Anthony Charles, she reports that this started about 6 months ago and the patient is "hardheaded and can be difficult" and he does argue with her about taking his medications sometimes because he gets tired of taking so many medications but eventually he will go ahead and take the medications.  She reports that he has never attempted to harm himself or to harm anyone else and that she has no safety concerns with him being discharged.  She reports that there is no substance abuse.  She reports that [place], the other caregiver, has been contacted and notified of the situation and states that he will be there to assist with the patient and medications and treatment.  Patient's wife states that she is ready for him to come home and she has no safety concerns about him discharging to the house.  At this time patient does not meet inpatient criteria and is psychiatrically cleared.  I have contacted Dr. Mariea Clonts and notified him of the recommendations.

## 2018-06-23 LAB — BASIC METABOLIC PANEL
Anion gap: 9 (ref 5–15)
BUN: 38 mg/dL — ABNORMAL HIGH (ref 8–23)
CHLORIDE: 101 mmol/L (ref 98–111)
CO2: 30 mmol/L (ref 22–32)
Calcium: 7.7 mg/dL — ABNORMAL LOW (ref 8.9–10.3)
Creatinine, Ser: 1.02 mg/dL (ref 0.61–1.24)
GFR calc Af Amer: >60 mL/min (ref >60)
GFR calc non Af Amer: >60 mL/min (ref >60)
Glucose, Bld: 121 mg/dL — ABNORMAL HIGH (ref 70–99)
Potassium: 2.9 mmol/L — ABNORMAL LOW (ref 3.5–5.1)
Sodium: 140 mmol/L (ref 135–145)

## 2018-06-23 LAB — MAGNESIUM: Magnesium: 1.8 mg/dL (ref 1.7–2.4)

## 2018-06-23 MED ORDER — CHLORHEXIDINE GLUCONATE CLOTH 2 % EX PADS
6.0000 | MEDICATED_PAD | Freq: Every day | CUTANEOUS | Status: DC
  Administered 2018-06-24 – 2018-06-25 (×2): 6 via TOPICAL

## 2018-06-23 MED ORDER — AMIODARONE HCL 200 MG PO TABS
200.0000 mg | ORAL_TABLET | Freq: Two times a day (BID) | ORAL | Status: DC
  Administered 2018-06-23 – 2018-06-25 (×5): 200 mg via ORAL
  Filled 2018-06-23 (×5): qty 1

## 2018-06-23 NOTE — Progress Notes (Signed)
Walked patient around center of ICU about 150  Feet tolerated well

## 2018-06-23 NOTE — Progress Notes (Signed)
Patient Demographics:    Anthony Charles, is a 73 y.o. male, DOB - 17-Nov-1945, ZOX:[id number]  Admit date - 06/19/2018   Admitting Physician Briscoe Deutscher, MD  Outpatient [facility] the [facility]  LOS - 3   Chief Complaint  Patient presents with  . 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 Admitted for atrial fibrillation with RVR and CHF exacerbation.   Plan:- 1)PAFib--with RVR--- rate control remains quite challenging, patient remains tachycardic, PTA Toprol 100 mg daily is on hold due to soft BP, per cardiology okay to continue IV amiodarone, Cardizem drip discontinued,, c/n  metoprolol 12.5 mg twice daily if BP allows, continue Eliquis 5 mg twice daily for stroke prophylaxis, okay to switch him to p.o. amiodarone 20 mg twice daily probably on 06/23/2018... We will stop IV amiodarone as of 06/23/2018.... Blood pressures too soft,  unable to titrate up metoprolol....EF is 50 to 55  %,   2)Depression/Anxiety-behavioral health assessment appreciated--- overall improved, continue Xanax as needed during the day and lorazepam IV nightly as needed, c/n trazodone 100 mg nightly scheduled.  No suicidal or homicidal ideation or plan at this time  3)HFpEF--- acute on chronic diastolic CHF exacerbation secondary to A. fib with RVR--- patient is negative more than 3 L, weight is down over 5 pounds to about 125 pounds,  per cardiology continue Lasix at 20 mg twice daily, EF in the 50 to 55% range with moderate MR  4)COPD with mild exacerbation --- overall improved, de-escalate bronchodilators due to persistent tachycardia, treated with Solu-Medrol,  switch to p.o. prednisone 40 mg daily in a.m.  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 Tach/status post AICD placement----AICD was  placed in [place], [place], -last shocked 6 months agostatus post previous stenting 2016 and 2018, suspect secondary to demand ischemia in the setting of A. fib with RVR, no ACS symptoms at this time  7)Elevated LFTs--- suspect secondary to hepatic congestion in the setting of CHF exacerbation in the setting of A. fib with RVR... Consider holding statins if continues to increase  8) acute hypoxic respiratory failure--- tnow resolved, patient is off oxygen,  hypoxic respiratory failure was secondary to #1, #3 #4 above  Disposition/Need for in-Hospital Stay- patient unable to be  discharged at this time due to A. fib with RVR, rate control remains very challenging despite IV amiodarone, CHF exacerbation and COPD exacerbation requiring supplemental oxygen, IV amiodarone... Soft blood pressures making it difficult to titrate up oral metoprolol  Code Status : Full code  Family Communication:   Wife by phone   Disposition Plan  : home  Consults  : Cardiology  DVT Prophylaxis  : Eliquis Lab Results  Component Value Date    PLT 267 06/21/2018    Inpatient Medications  Scheduled Meds: . amiodarone  200 mg Oral BID  . apixaban  5 mg Oral BID  . atorvastatin  20 mg Oral q1800  . budesonide (PULMICORT) nebulizer solution  0.5 mg Nebulization BID  . Chlorhexidine Gluconate Cloth  6 each Topical Q0600  . dextromethorphan-guaiFENesin  1 tablet Oral BID  . furosemide  20 mg Intravenous BID  . levothyroxine  112 mcg Oral Q0600  . metoprolol tartrate  12.5 mg Oral BID  . potassium chloride  20 mEq Oral BID  . predniSONE  40 mg Oral Q breakfast  . sodium chloride flush  3 mL Intravenous Q12H  . traZODone  100 mg Oral QHS   Continuous Infusions: . sodium chloride    . magnesium sulfate 1 - 4 g bolus IVPB     PRN Meds:.sodium chloride, acetaminophen, ALPRAZolam, ipratropium-albuterol, levalbuterol, LORazepam, ondansetron (ZOFRAN) IV, sodium chloride flush    Anti-infectives (From admission, onward)   None        Objective:   Vitals:   06/23/18 1000 06/23/18 1100 06/23/18 1200 06/23/18 1300  BP: 95/63 (!) 87/60 95/67 (!) 88/66  Pulse: 89 (!) 107 (!) 120 (!) 59  Resp: 11 19 18  (!) 31  Temp:  97.9 F (36.6 C)    TempSrc:  Axillary    SpO2: 92% (!) 88% (!) 89% (!) 89%  Weight:      Height:        Wt Readings from Last 3 Encounters:  06/23/18 57.9 kg  06/16/18 64 kg     Intake/Output Summary (Last 24 hours) at 06/23/2018 1840 Last data filed at 06/23/2018 1320 Gross per 24 hour  Intake 1648.6 ml  Output 1125 ml  Net 523.6 ml    Physical Exam Patient is examined daily including today on 06/23/18 , exams remain the same as of yesterday except that has changed   Gen:- Awake Alert, more relaxed HEENT:- [place].AT, No sclera icterus Neck-Supple Neck,No JVD,.  Lungs-diminished breath sounds , no wheezing  CV- S1, S2 normal, irregularly irregular, left subclavian area with AICD in situ  abd-  +ve B.Sounds, Abd Soft, No tenderness,    Extremity/Skin:- No  edema, pedal pulses present   Psych-affect is anxious/depressed oriented x3 Neuro-no new focal deficits, no tremors   Data Review:   Micro Results Recent Results (from the past 240 hour(s))  MRSA PCR Screening     Status: None   Collection Time: 06/13/18  9:37 PM  Result Value Ref Range Status   MRSA by PCR NEGATIVE NEGATIVE Final    Comment:        The GeneXpert MRSA Assay (FDA approved for NASAL specimens only), is one component of a comprehensive MRSA colonization surveillance program. It is not intended to diagnose MRSA infection nor to guide or monitor treatment for MRSA infections. Performed at [facility], [address], [place], [place] [address]   Blood culture (routine x 2)     Status: None (Preliminary result)   Collection  Time: 06/19/18  7:32 PM  Result Value Ref Range Status   Specimen Description BLOOD RIGHT FOREARM  Final   Special Requests   Final    BOTTLES DRAWN AEROBIC AND ANAEROBIC Blood Culture adequate volume   Culture   Final    NO GROWTH 4 DAYS Performed at [facility], [address] [place], [place] [address]    Report Status PENDING  Incomplete  Blood culture (routine x 2)     Status: None (Preliminary result)   Collection Time: 06/19/18  7:37 PM  Result Value Ref Range Status   Specimen Description LEFT ANTECUBITAL  Final   Special Requests   Final    BOTTLES DRAWN AEROBIC AND ANAEROBIC Blood Culture adequate volume   Culture   Final    NO GROWTH 4 DAYS Performed at [facility], [address] [place], [place] [address]    Report Status PENDING  Incomplete    Radiology Reports Dg Chest 2 View  Result Date: 06/13/2018 CLINICAL DATA:  Weakness, body aches, cough EXAM: CHEST - 2 VIEW COMPARISON:  None. FINDINGS: Pulmonary hyperinflation and probable emphysematous change. No acute abnormality of the lungs. Left chest multi lead pacer defibrillator. Cardiomegaly. Disc degenerative disease of the thoracic spine. IMPRESSION: Pulmonary hyperinflation and probable  emphysematous change. No acute appearing airspace opacity. Cardiomegaly. Electronically Signed   By: Lauralyn PrimesAlex  Bibbey M.D.   On: 06/13/2018 14:45   Dg Pelvis Portable  Result Date: 06/21/2018 CLINICAL DATA:  Hip abrasion, no known injury EXAM: PORTABLE PELVIS 1-2 VIEWS COMPARISON:  None. FINDINGS: There is no evidence of pelvic fracture or diastasis. No pelvic bone lesions are seen. IMPRESSION: Negative. Electronically Signed   By: Marlan Palauharles  Clark M.D.   On: 06/21/2018 14:43   Dg Chest Port 1 View  Result Date: 06/19/2018 CLINICAL DATA:  Cough, shortness of breath and chest pain. EXAM: PORTABLE CHEST 1 VIEW COMPARISON:  06/13/2018 FINDINGS: Stable heart size and appearance of dual-chamber pacemaker. Since the prior study, there has been development of mild pulmonary edema. No pleural effusions or focal airspace consolidation. No pneumothorax. Stable chronic lung disease. IMPRESSION: Development of mild pulmonary edema. Electronically Signed   By: Irish LackGlenn  Yamagata M.D.   On: 06/19/2018 20:30     CBC Recent Labs  Lab 06/19/18 1932 06/20/18 0611 06/21/18 1539  WBC 12.4* 11.1* 17.8*  HGB 9.5* 8.8* 8.9*  HCT 29.3* 27.3* 27.9*  PLT 214 210 267  MCV 98.0 97.8 99.6  MCH 31.8 31.5 31.8  MCHC 32.4 32.2 31.9  RDW 14.0 13.7 13.7  LYMPHSABS 0.9 1.1  --   MONOABS 0.7 0.8  --   EOSABS 0.0 0.0  --   BASOSABS 0.0 0.0  --     Chemistries  Recent Labs  Lab 06/19/18 1932 06/20/18 0611 06/21/18 0513 06/23/18 0810  NA 141 140 141 140  K 3.1* 3.4* 3.1* 2.9*  CL 111 109 106 101  CO2 19* 23 27 30   GLUCOSE 115* 110* 178* 121*  BUN 27* 24* 25* 38*  CREATININE 0.91 0.96 0.90 1.02  CALCIUM 8.0* 7.7* 7.8* 7.7*  MG  --  2.1  --  1.8  AST 42* 33 28  --   ALT 41 34 29  --   ALKPHOS 78 68 70  --   BILITOT 2.0* 1.8* 1.1  --    ------------------------------------------------------------------------------------------------------------------ Recent Labs    06/22/18 0441  CHOL 90  HDL 28*  LDLCALC 42   TRIG 99  CHOLHDL 3.2  No results found for: HGBA1C ------------------------------------------------------------------------------------------------------------------ No results for input(s): TSH, T4TOTAL, T3FREE, THYROIDAB in the last 72 hours.  Invalid input(s): FREET3 ------------------------------------------------------------------------------------------------------------------ No results for input(s): VITAMINB12, FOLATE, FERRITIN, TIBC, IRON, RETICCTPCT in the last 72 hours.  Coagulation profile No results for input(s): INR, PROTIME in the last 168 hours.  No results for input(s): DDIMER in the last 72 hours.  Cardiac Enzymes Recent Labs  Lab 06/19/18 1932 06/20/18 0016 06/20/18 0611  TROPONINI 0.05* 0.05* 0.04*   ------------------------------------------------------------------------------------------------------------------    Component Value Date/Time   BNP 1,319.0 (H) 06/19/2018 1932     Shon Hale M.D on 06/23/2018 at 6:40 PM  Go to [email or web address] - for contact info  [facility] - Office  [phone]

## 2018-06-24 LAB — BASIC METABOLIC PANEL
Anion gap: 8 (ref 5–15)
BUN: 36 mg/dL — ABNORMAL HIGH (ref 8–23)
CHLORIDE: 102 mmol/L (ref 98–111)
CO2: 29 mmol/L (ref 22–32)
Calcium: 7.9 mg/dL — ABNORMAL LOW (ref 8.9–10.3)
Creatinine, Ser: 1.08 mg/dL (ref 0.61–1.24)
GFR calc Af Amer: >60 mL/min (ref >60)
GFR calc non Af Amer: >60 mL/min (ref >60)
Glucose, Bld: 136 mg/dL — ABNORMAL HIGH (ref 70–99)
Potassium: 4.6 mmol/L (ref 3.5–5.1)
Sodium: 139 mmol/L (ref 135–145)

## 2018-06-24 LAB — CULTURE, BLOOD (ROUTINE X 2)
CULTURE: NO GROWTH
Culture: NO GROWTH
Special Requests: ADEQUATE
Special Requests: ADEQUATE

## 2018-06-24 MED ORDER — FUROSEMIDE 10 MG/ML IJ SOLN
20.0000 mg | Freq: Every day | INTRAMUSCULAR | Status: DC
  Administered 2018-06-25: 20 mg via INTRAVENOUS
  Filled 2018-06-24: qty 2

## 2018-06-24 MED ORDER — POTASSIUM CHLORIDE CRYS ER 20 MEQ PO TBCR
40.0000 meq | EXTENDED_RELEASE_TABLET | Freq: Once | ORAL | Status: AC
  Administered 2018-06-24: 40 meq via ORAL
  Filled 2018-06-24: qty 2

## 2018-06-24 MED ORDER — PREDNISONE 20 MG PO TABS
20.0000 mg | ORAL_TABLET | Freq: Every day | ORAL | Status: AC
  Administered 2018-06-24: 20 mg via ORAL
  Filled 2018-06-24: qty 1

## 2018-06-24 MED ORDER — MAGNESIUM SULFATE 2 GM/50ML IV SOLN
2.0000 g | Freq: Once | INTRAVENOUS | Status: AC
  Administered 2018-06-24: 2 g via INTRAVENOUS
  Filled 2018-06-24: qty 50

## 2018-06-24 NOTE — Progress Notes (Signed)
Patient Demographics:    Anthony Charles, is a 73 y.o. male, DOB - 10/03/1945, ZOX:[id number]  Admit date - 06/19/2018   Admitting Physician Briscoe Deutscher, MD  [facility]patient [facility]  LOS - 4   Chief Complaint  Patient presents with  . 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 Admitted for atrial fibrillation with RVR and CHF exacerbation.   Plan:- 1)PAFib--with RVR--- rate control remains quite challenging, patient remains tachycardic, PTA Toprol 100 mg daily is on hold due to soft BP, per cardiology okay to continue IV amiodarone, Cardizem drip discontinued,, c/n  metoprolol 12.5 mg twice daily if BP allows, continue Eliquis 5 mg twice daily for stroke prophylaxis,   stopped IV amiodarone as of 06/23/2018.... Blood pressures too soft,  unable to titrate up metoprolol....EF is 50 to 55 %, continue metoprolol 200 mg  twice daily started on 06/23/2018  2)Depression/Anxiety-behavioral health assessment appreciated--- overall improved, continue Xanax as needed during the day and lorazepam IV nightly as needed, c/n trazodone 100 mg nightly scheduled.  No suicidal or homicidal ideation or plan at this time  3)HFpEF--- acute on chronic diastolic CHF exacerbation secondary to A. fib with RVR--- diuresed well, fluid balance negative, weight is down to 57.9 kg, okay to change Lasix to 20 mg daily, EF in the 50 to 55% range with moderate MR  4)COPD with mild exacerbation --- overall improved, de-escalate bronchodilators due to persistent tachycardia, treated with Solu-Medrol, okay to complete p.o. 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 fib with RVR, no ACS symptoms at this time  7)Elevated LFTs--- suspect secondary to hepatic congestion in the setting of CHF exacerbation in the setting of A. fib with RVR... Consider holding statins if continues to increase  8) acute hypoxic respiratory failure--- now resolved, patient is off oxygen,  hypoxic respiratory failure was secondary to #1, #3 #4 above  Disposition/Need for in-Hospital Stay- patient unable to be discharged at this time due to A. fib with RVR, rate control  remains very challenging despite IV and oral amiodarone, CHF exacerbation and COPD exacerbation requiring supplemental oxygen,.. Soft blood pressures making it difficult to titrate up oral metoprolol  Code Status : Full code  Family Communication:   Wife by phone   Disposition Plan  : home  Consults  : Cardiology  DVT Prophylaxis  : Eliquis Lab Results  Component Value Date   PLT 267 06/21/2018     Inpatient Medications  Scheduled Meds: . amiodarone  200 mg Oral BID  . apixaban  5 mg Oral BID  . atorvastatin  20 mg Oral q1800  . budesonide (PULMICORT) nebulizer solution  0.5 mg Nebulization BID  . Chlorhexidine Gluconate Cloth  6 each Topical Q0600  . dextromethorphan-guaiFENesin  1 tablet Oral BID  . furosemide  20 mg Intravenous BID  . levothyroxine  112 mcg Oral Q0600  . metoprolol tartrate  12.5 mg Oral BID  . potassium chloride  20 mEq Oral BID  . sodium chloride flush  3 mL Intravenous Q12H  . traZODone  100 mg Oral QHS   Continuous Infusions: . sodium chloride    . magnesium sulfate 1 - 4 g bolus IVPB     PRN Meds:.sodium chloride, acetaminophen, ALPRAZolam, ipratropium-albuterol, levalbuterol, LORazepam, ondansetron (ZOFRAN) IV, sodium chloride flush    Anti-infectives (From admission, onward)   None        Objective:   Vitals:   06/24/18 1500 06/24/18 1600 06/24/18 1655 06/24/18 1700  BP: (!) 79/57 (!) 79/69  (!) 88/67  Pulse: 82 (!) 120  84  Resp: 18 16  16   Temp:   98.1 F (36.7 C)   TempSrc:   Axillary   SpO2:  92%  92%  Weight:      Height:        Wt Readings from Last 3 Encounters:  06/24/18 57.9 kg  06/16/18 64 kg     Intake/Output Summary (Last 24 hours) at 06/24/2018 1750 Last data filed at 06/24/2018 1500 Gross per 24 hour  Intake 360 ml  Output 1050 ml  Net -690 ml    Physical Exam Patient is examined daily including today on 06/24/18 , exams remain the same as of yesterday except that has changed   Gen:- Awake Alert, more relaxed HEENT:- [place].AT, No sclera icterus Neck-Supple Neck,No JVD,.  Lungs-improving air movement, no wheezing  CV- S1, S2 normal, irregularly irregular, left subclavian area with AICD in situ  abd-  +ve B.Sounds, Abd Soft, No tenderness,    Extremity/Skin:- No  edema, pedal pulses present  Psych-affect is anxious/depressed oriented x3 Neuro- No new focal deficits, no tremors      Data Review:   Micro  Results Recent Results (from the past 240 hour(s))  Blood culture (routine x 2)     Status: None   Collection Time: 06/19/18  7:32 PM  Result Value Ref Range Status   Specimen Description BLOOD RIGHT FOREARM  Final   Special Requests   Final    BOTTLES DRAWN AEROBIC AND ANAEROBIC Blood Culture adequate volume   Culture   Final    NO GROWTH 5 DAYS Performed at [facility], [address] [place], [place] [address]    Report Status 06/24/2018 FINAL  Final  Blood culture (routine x 2)     Status: None   Collection Time: 06/19/18  7:37 PM  Result Value Ref Range Status   Specimen Description LEFT ANTECUBITAL  Final   Special Requests   Final    BOTTLES  DRAWN AEROBIC AND ANAEROBIC Blood Culture adequate volume   Culture   Final    NO GROWTH 5 DAYS Performed at [facility], [address], [place], [place] [address]    Report Status 06/24/2018 FINAL  Final    Radiology Reports Dg Chest 2 View  Result Date: 06/13/2018 CLINICAL DATA:  Weakness, body aches, cough EXAM: CHEST - 2 VIEW COMPARISON:  None. FINDINGS: Pulmonary hyperinflation and probable emphysematous change. No acute abnormality of the lungs. Left chest multi lead pacer defibrillator. Cardiomegaly. Disc degenerative disease of the thoracic spine. IMPRESSION: Pulmonary hyperinflation and probable emphysematous change. No acute appearing airspace opacity. Cardiomegaly. Electronically Signed   By: Lauralyn Primes M.D.   On: 06/13/2018 14:45   Dg Pelvis Portable  Result Date: 06/21/2018 CLINICAL DATA:  Hip abrasion, no known injury EXAM: PORTABLE PELVIS 1-2 VIEWS COMPARISON:  None. FINDINGS: There is no evidence of pelvic fracture or diastasis. No pelvic bone lesions are seen. IMPRESSION: Negative. Electronically Signed   By: Marlan Palau M.D.   On: 06/21/2018 14:43   Dg Chest Port 1 View  Result Date: 06/19/2018 CLINICAL DATA:  Cough, shortness of breath and chest pain. EXAM: PORTABLE CHEST 1 VIEW COMPARISON:  06/13/2018  FINDINGS: Stable heart size and appearance of dual-chamber pacemaker. Since the prior study, there has been development of mild pulmonary edema. No pleural effusions or focal airspace consolidation. No pneumothorax. Stable chronic lung disease. IMPRESSION: Development of mild pulmonary edema. Electronically Signed   By: Irish Lack M.D.   On: 06/19/2018 20:30     CBC Recent Labs  Lab 06/19/18 1932 06/20/18 0611 06/21/18 1539  WBC 12.4* 11.1* 17.8*  HGB 9.5* 8.8* 8.9*  HCT 29.3* 27.3* 27.9*  PLT 214 210 267  MCV 98.0 97.8 99.6  MCH 31.8 31.5 31.8  MCHC 32.4 32.2 31.9  RDW 14.0 13.7 13.7  LYMPHSABS 0.9 1.1  --   MONOABS 0.7 0.8  --   EOSABS 0.0 0.0  --   BASOSABS 0.0 0.0  --     Chemistries  Recent Labs  Lab 06/19/18 1932 06/20/18 0611 06/21/18 0513 06/23/18 0810 06/24/18 1504  NA 141 140 141 140 139  K 3.1* 3.4* 3.1* 2.9* 4.6  CL 111 109 106 101 102  CO2 19* GLUCOSE 115* 110* 178* 121* 136*  BUN 27* 24* 25* 38* 36*  CREATININE 0.91 0.96 0.90 1.02 1.08  CALCIUM 8.0* 7.7* 7.8* 7.7* 7.9*  MG  --  2.1  --  1.8  --   AST 42* 33 28  --   --   ALT 41 34 29  --   --   ALKPHOS 78 68 70  --   --   BILITOT 2.0* 1.8* 1.1  --   --    ------------------------------------------------------------------------------------------------------------------ Recent Labs    06/22/18 0441  CHOL 90  HDL 28*  LDLCALC 42  TRIG 99  CHOLHDL 3.2    No results found for: HGBA1C ------------------------------------------------------------------------------------------------------------------ No results for input(s): TSH, T4TOTAL, T3FREE, THYROIDAB in the last 72 hours.  Invalid input(s): FREET3 ------------------------------------------------------------------------------------------------------------------ No results for input(s): VITAMINB12, FOLATE, FERRITIN, TIBC, IRON, RETICCTPCT in the last 72 hours.  Coagulation profile No results for input(s): INR, PROTIME in the  last 168 hours.  No results for input(s): DDIMER in the last 72 hours.  Cardiac Enzymes Recent Labs  Lab 06/19/18 1932 06/20/18 0016 06/20/18 0611  TROPONINI 0.05* 0.05* 0.04*   ------------------------------------------------------------------------------------------------------------------    Component Value Date/Time  BNP 1,319.0 (H) 06/19/2018 1932   Shon Hale M.D on 06/24/2018 at 5:50 PM  Go to [email or web address] - for contact info  [facility] - Office  [phone]

## 2018-06-25 DIAGNOSIS — Z7189 Other specified counseling: Secondary | ICD-10-CM

## 2018-06-25 DIAGNOSIS — J441 Chronic obstructive pulmonary disease with (acute) exacerbation: Secondary | ICD-10-CM

## 2018-06-25 DIAGNOSIS — Z515 Encounter for palliative care: Secondary | ICD-10-CM

## 2018-06-25 MED ORDER — AMIODARONE HCL 200 MG PO TABS: 200.0000 mg | ORAL_TABLET | Freq: Two times a day (BID) | ORAL | 1 refills | Status: DC

## 2018-06-25 MED ORDER — ACETAMINOPHEN 325 MG PO TABS: 650.0000 mg | ORAL_TABLET | ORAL | 0 refills | Status: DC | PRN

## 2018-06-25 MED ORDER — ALPRAZOLAM 0.25 MG PO TABS: 0.2500 mg | ORAL_TABLET | Freq: Three times a day (TID) | ORAL | 0 refills | Status: DC | PRN

## 2018-06-25 MED ORDER — BUSPIRONE HCL 5 MG PO TABS: 5.0000 mg | ORAL_TABLET | Freq: Three times a day (TID) | ORAL | 3 refills | Status: DC

## 2018-06-25 MED ORDER — METOPROLOL SUCCINATE ER 25 MG PO TB24: 25.0000 mg | ORAL_TABLET | Freq: Every day | ORAL | 11 refills | Status: DC

## 2018-06-25 MED ORDER — ATORVASTATIN CALCIUM 20 MG PO TABS: 20.0000 mg | ORAL_TABLET | Freq: Every day | ORAL | 2 refills | Status: AC

## 2018-06-25 MED ORDER — APIXABAN 5 MG PO TABS: 5.0000 mg | ORAL_TABLET | Freq: Two times a day (BID) | ORAL | 3 refills | Status: AC

## 2018-06-25 MED ORDER — TRAZODONE HCL 100 MG PO TABS: 200.0000 mg | ORAL_TABLET | Freq: Every day | ORAL | 3 refills | Status: DC

## 2018-06-25 MED ORDER — FUROSEMIDE 20 MG PO TABS: 20.0000 mg | ORAL_TABLET | ORAL | 11 refills | Status: DC

## 2018-06-25 NOTE — Progress Notes (Signed)
Daily Progress Note   Patient Name: Anthony Charles       Date: 06/25/2018 DOB: [birth year]  Age: 73 y.o. MRN#: [id number] Attending Physician: Shon Hale, MD Primary Care Physician: Jonelle Sidle, MD Admit Date: 06/19/2018  Reason for Consultation/Follow-up: Establishing goals of care   Subjective/GOC:  Patient awake, alert, oriented. Sitting up in chair. Appears comfortable without s/s of distress. On room air. Speaking with wife via telephone.  Introduced Palliative Medicine as specialized medical care for people living with serious illness. It focuses on providing relief from the symptoms and stress of a serious illness. The goal is to improve quality of life for both the patient and the family.  Patient is eager to get home and will be discharged today. He is waiting on paperwork from RN. Reports that he feels much better.  Patient does not have a documented living will. Introduced importance of considering completion of advance directive and documenting trusted decision maker with underlying chronic conditions. Explained importance of having early conversations in order to honor his wishes if he was unable to make decisions. He confirms that his wife would be primary HCPOA. Provided and reviewed AD packet and MOST form. Also gave Hard Choices booklet for review. Patient appreciative of information and plans to further discuss with his wife.   Answered questions and concerns.   Length of Stay: 5  Current Medications: Scheduled Meds:  . amiodarone  200 mg Oral BID  . apixaban  5 mg Oral BID  . atorvastatin  20 mg Oral q1800  . budesonide (PULMICORT) nebulizer solution  0.5 mg Nebulization BID  . Chlorhexidine Gluconate Cloth  6 each Topical Q0600  .  dextromethorphan-guaiFENesin  1 tablet Oral BID  . furosemide  20 mg Intravenous Daily  . levothyroxine  112 mcg Oral Q0600  . metoprolol tartrate  12.5 mg Oral BID  . potassium chloride  20 mEq Oral BID  . sodium chloride flush  3 mL Intravenous Q12H  . traZODone  100 mg Oral QHS    Continuous Infusions: . sodium chloride    . magnesium sulfate 1 - 4 g bolus IVPB      PRN Meds: sodium chloride, acetaminophen, ALPRAZolam, ipratropium-albuterol, levalbuterol, LORazepam, ondansetron (ZOFRAN) IV, sodium chloride flush  Physical Exam Vitals signs and nursing note reviewed.  Constitutional:  General: He is awake.  Pulmonary:     Effort: No tachypnea, accessory muscle usage or respiratory distress.  Skin:    General: Skin is warm and dry.     Coloration: Skin is pale.  Neurological:     Mental Status: He is alert and oriented to person, place, and time.  Psychiatric:        Mood and Affect: Mood normal.        Speech: Speech normal.        Behavior: Behavior is cooperative.        Cognition and Memory: Cognition normal.            Vital Signs: BP 114/75   Pulse (!) 25   Temp 97.6 F (36.4 C) (Oral)   Resp 17   Ht 5\' 10"  (1.778 m)   Wt 57.1 kg   SpO2 96%   BMI 18.06 kg/m  SpO2: SpO2: 96 % O2 Device: O2 Device: Nasal Cannula O2 Flow Rate: O2 Flow Rate (L/min): 2 L/min  Intake/output summary:   Intake/Output Summary (Last 24 hours) at 06/25/2018 1136 Last data filed at 06/25/2018 0800 Gross per 24 hour  Intake 301.2 ml  Output 775 ml  Net -473.8 ml   LBM: Last BM Date: 06/17/18 Baseline Weight: Weight: 64 kg Most recent weight: Weight: 57.1 kg       Palliative Assessment/Data: PPS 60%   Flowsheet Rows     Most Recent Value  Intake Tab  Referral Department  Hospitalist  Unit at Time of Referral  ICU  Palliative Care Primary Diagnosis  Cardiac  Date Notified  06/22/18  Palliative Care Type  New Palliative care  Reason for referral  Clarify Goals of  Care  Date of Admission  06/19/18  # of days IP prior to Palliative referral  3  Clinical Assessment  Psychosocial & Spiritual Assessment  Palliative Care Outcomes      Patient Active Problem List   Diagnosis Date Noted  . COPD with acute exacerbation (HCC) 06/20/2018  . Atrial fibrillation with RVR (HCC) 06/19/2018  . Normocytic anemia 06/19/2018  . Hypothyroidism 06/19/2018  . Hyperbilirubinemia 06/19/2018  . Elevated troponin 06/19/2018  . Malnutrition of moderate degree 06/16/2018  . Sepsis (HCC) 06/13/2018  . Influenza A 06/13/2018  . Acute respiratory failure with hypoxia (HCC) 06/13/2018  . CAD in native artery 06/13/2018  . Hx of myocardial infarction 06/13/2018  . H/O agent Orange exposure 06/13/2018  . Protein calorie malnutrition (HCC) 06/13/2018  . Generalized weakness 06/13/2018  . Hypokalemia 06/13/2018  . Acute on chronic diastolic CHF (congestive heart failure) (HCC) 06/13/2018  . Atrial fibrillation, chronic 06/13/2018  . 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 Admitted for atrial fibrillation with RVR and CHF exacerbation.  Assessment: Afib RVR Diastolic CHF exacerbation COPD with mild exacerbation Acute hypoxic respiratory failure Depression Anxiety  Recommendations/Plan:  Clinical improvement with plan for discharge home today.   Introduced role of palliative medicine. Introduced and encouraged completion of AD packet, HCPOA designation and/or MOST form. Educational information provided.   Unfortunately limited outpatient palliative  resources in [place].    Goals of Care and Additional Recommendations:  Limitations  on Scope of Treatment: Full Scope Treatment  Code Status: FULL   Code Status Orders  (From admission, onward)         Start     Ordered   06/19/18 2132  Full code  Continuous     06/19/18 2134        Code Status History    Date Active Date Inactive Code Status Order ID Comments User Context   06/13/2018 2137 06/16/2018 1346 Full Code [id number]  Eddie North, MD Inpatient       Prognosis:   Unable to determine  Discharge Planning:  Home with Home Health  Care plan was discussed with patient, wife, RN, Dr. Mariea Clonts  Thank you for allowing the Palliative Medicine Team to assist in the care of this patient.   Time In: 1100 Time Out: 1135 Total Time 35 Prolonged Time Billed  no      Greater than 50%  of this time was spent counseling and coordinating care related to the above assessment and plan.  Vennie Homans, FNP-C Palliative Medicine Team  Phone: [phone] Fax: [phone]  Please contact Palliative Medicine Team phone at [phone] for questions and concerns.

## 2018-06-25 NOTE — Discharge Instructions (Signed)
1)Very low-salt diet advised 2)Weigh yourself daily, call if you gain more than 3 pounds in 1 day or more than 5 pounds in 1 week as your diuretic medications may need to be adjusted 3)Limit your Fluid  intake to no more than 60 ounces (1.8 Liters) per day 4)Follow up with Dr. Wyline Mood cardiologist at [facility] in 1 to 2 weeks for recheck  And Repeat BMP Blood Test 5) you are taking Eliquis/apixaban which is a blood thinner so Avoid ibuprofen/Advil/Aleve/Motrin/Goody Powders/Naproxen/BC powders/Meloxicam/Diclofenac/Indomethacin and other Nonsteroidal anti-inflammatory medications as these will make you more likely to bleed and can cause stomach ulcers, can also cause Kidney problems.

## 2018-06-25 NOTE — Discharge Summary (Signed)
Anthony Charles, is a 73 y.o. male  DOB 1946-03-18  MRN [id number].  Admission date:  06/19/2018  Admitting Physician  Briscoe Deutscher, MD  Discharge Date:  06/25/2018   Primary MD  Jonelle Sidle, MD  Recommendations for primary care physician for things to follow:  1)Very low-salt diet advised 2)Weigh yourself daily, call if you gain more than 3 pounds in 1 day or more than 5 pounds in 1 week as your diuretic medications may need to be adjusted 3)Limit your Fluid  intake to no more than 60 ounces (1.8 Liters) per day 4)Follow up with Dr. Wyline Mood cardiologist at [facility] in 1 to 2 weeks for recheck  And Repeat BMP Blood Test 5) you are taking Eliquis/apixaban which is a blood thinner so Avoid ibuprofen/Advil/Aleve/Motrin/Goody Powders/Naproxen/BC powders/Meloxicam/Diclofenac/Indomethacin and other Nonsteroidal anti-inflammatory medications as these will make you more likely to bleed and can cause stomach ulcers, can also cause Kidney problems.   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   Normocytic anemia   Hypothyroidism   Hyperbilirubinemia   Elevated troponin   COPD with acute exacerbation (HCC)      Past Medical History:  Diagnosis Date   Agent orange exposure    AICD (automatic cardioverter/defibrillator) present    Aneurysm (HCC)    Atrial fibrillation (HCC)    CAD  (coronary artery disease)    Diabetes mellitus without complication (HCC)    Hernia of abdominal wall    Thyroid disease     Past Surgical History:  Procedure Laterality Date   pacemaker/defibrillator         HPI  from the history and physical done on the day of admission:    Chief Complaint: SOB   HPI: Anthony Charles is a 73 y.o. male with medical history significant for atrial fibrillation on Eliquis, chronic diastolic CHF, hypothyroidism, and coronary artery disease, now presenting to the emergency department for evaluation of shortness of breath.  Patient was admitted to the hospital on 06/13/2018, discharged 3 days later after treatment for influenza A with acute hypoxic respiratory failure.  Patient's symptoms had nearly resolved at time of hospital discharge and he was saturating adequately on room air at that time.  Unfortunately, since returning home, he has developed a recurrence and shortness of breath.  Patient reports improvement in his cough since the recent hospitalization, denies any fevers or chills since discharge, denies any chest pain, but reports worsening bilateral lower extremity swelling and has also developed orthopnea over the past couple days, stating that he has been unable to sleep  since leaving the hospital due to orthopnea and shortness of breath.  ED Course: Upon arrival to the ED, patient is found to be afebrile, saturating low 90s on room air, mildly tachypneic, tachycardic in the 140s, and with blood pressure 85/73.  EKG features atrial fibrillation with RVR, rate 148, and repolarization abnormality.  Chest x-ray is notable for mild pulmonary edema.  Chemistry panel features a potassium of 3.1, AST 42, and bilirubin 2.0.  CBC features a new leukocytosis to 12,400 and a stable normocytic anemia with hemoglobin 9.5.  Lactic acid is reassuringly normal, troponin is slightly elevated at 0.05, and BNP is elevated to 1320.  Patient was given 20 mg IV diltiazem and  started on diltiazem infusion in the ED. Heart rate has improved, SBP is in the 90s, and the patient will be sent to the stepdown unit for ongoing evaluation and management.   Hospital Course:    Brief Summary 73 y.o.malew/ PMHof CAD (s/p prior stentingin 2016 and 2018 per patient's report), history ofPPM/ICD for presumed VT per patient's history, paroxysmal atrial fibrillation (on Eliquis and Amiodarone), Hypothyroidism, HTN, HLD, COPD and Agent Orange Exposurewho presented to [facility] on 06/19/2018 for evaluation of worsening dyspnea. Admitted for atrial fibrillation with RVR and CHF exacerbation.  Plan:- 1)PAFib--with RVR--- rate control improved, patient was initially treated with IV amiodarone, PTA Toprol 100 mg daily is on hold due to soft BP, call for Dr. Diona Browner from cardiology service prior to discharge home on 06/25/2018 , he advised p.o. amiodarone 200 mg Twice daily for a week and then changed to 200 mg daily after that, Toprol-XL 25 mg daily .continue Eliquis 5 mg twice daily for stroke prophylaxis,   .Marland KitchenMarland KitchenEF is 50 to 55 %,   2)Depression/Anxiety-behavioral health assessment appreciated--- overall improved, continue Xanax as needed , BuSpar as prescribed, c/n trazodone  nightly scheduled.  No suicidal or homicidal ideation or plan at this time  3)HFpEF--- acute on chronic diastolic CHF exacerbation secondary to A. fib with RVR--- diuresed well, fluid balance negative, weight is down to 57.9 kg,  Lasix as prescribed,, EF in the 50 to 55% range with moderate MR  4)COPD with mild exacerbation --- overall improved, de-escalate bronchodilators due to persistent tachycardia, treated with Solu-Medrol, completed p.o. prednisone  5)Hypothyroidism--- stable , TSH is 1.1 continue levothyroxine 112 mcg daily  6)Elevated troponin/history of CAD/presumed prior V. Tach/status post AICD placement----AICD was  placed in [place], [place], -last shocked 6 months agostatus post previous  stenting 2016 and 2018, suspect secondary to demand ischemia in the setting of A. fib with RVR, no ACS symptoms at this time  7)Elevated LFTs--- suspect secondary to hepatic congestion in the setting of CHF exacerbation in the setting of A. fib with RVR... LFTs have normalized  8) acute hypoxic respiratory failure--- now resolved, patient is off oxygen,  hypoxic respiratory failure was secondary to #1, #3 #4 above   Code Status : Full code  Family Communication:   Wife by phone   Disposition Plan  : home...Marland KitchenMarland Kitchen Plan of care and cardiac medication adjustment discussed with cardiology Dr. Diona Browner by phone on the day of discharge  Consults  : Cardiology/Paliative care and behavioral health   Discharge Condition: stable   Follow UP--- Dr. cardiologist  Diet and Activity recommendation:  As advised  Discharge Instructions    Discharge Instructions    Call MD for:  difficulty breathing, headache or visual disturbances   Complete by:  As directed    Call MD for:  persistant dizziness or light-headedness   Complete by:  As directed    Call MD for:  persistant nausea and vomiting   Complete by:  As directed    Call MD for:  severe uncontrolled pain   Complete by:  As directed    Call MD for:  temperature >100.4   Complete by:  As directed    Diet - low sodium heart healthy   Complete by:  As directed    Discharge instructions   Complete by:  As directed    1)Very low-salt diet advised 2)Weigh yourself daily, call if you gain more than 3 pounds in 1 day or more than 5 pounds in 1 week as your diuretic medications may need to be adjusted 3)Limit your Fluid  intake to no more than 60 ounces (1.8 Liters) per day 4)Follow up with Dr. Wyline Mood cardiologist at [facility] in 1 to 2 weeks for recheck  And Repeat BMP Blood Test 5) you are taking Eliquis/apixaban which is a blood thinner so Avoid ibuprofen/Advil/Aleve/Motrin/Goody Powders/Naproxen/BC  powders/Meloxicam/Diclofenac/Indomethacin and other Nonsteroidal anti-inflammatory medications as these will make you more likely to bleed and can cause stomach ulcers, can also cause Kidney problems.   Increase activity slowly   Complete by:  As directed         Discharge Medications     Allergies as of 06/25/2018   No Known Allergies     Medication List    STOP taking these medications   dextromethorphan-guaiFENesin 30-600 MG 12hr tablet Commonly known as:  MUCINEX DM   ibuprofen 600 MG tablet Commonly known as:  ADVIL,MOTRIN   oseltamivir 75 MG capsule Commonly known as:  TAMIFLU   predniSONE 20 MG tablet Commonly known as:  Deltasone     TAKE these medications   acetaminophen 325 MG tablet Commonly known as:  TYLENOL Take 2 tablets (650 mg total) by mouth every 4 (four) hours as needed for mild pain, fever or headache.   ALPRAZolam 0.25 MG tablet Commonly known as:  XANAX Take 1 tablet (0.25 mg total) by mouth 3 (three) times daily as needed for anxiety or sleep.   amiodarone 200 MG tablet Commonly known as:  PACERONE Take 1 tablet (200 mg total) by mouth 2 (two) times daily. For 7 Days (last dose 07/02/2018), then 200 mg daily starting 07/03/2018........Marland KitchenFor Heart What changed:    how much to take  when to take this  additional instructions   apixaban 5 MG Tabs tablet Commonly known as:  Eliquis Take 1 tablet (5 mg total) by mouth 2 (two) times daily.   atorvastatin 20 MG tablet Commonly known as:  LIPITOR Take 1 tablet (20 mg total) by mouth at bedtime.   busPIRone 5 MG tablet Commonly known as:  BUSPAR Take 1 tablet (5 mg total) by mouth 3 (three) times daily. For Anxiety   cetirizine 10 MG tablet Commonly known as:  ZYRTEC Take 5 mg by mouth daily.   feeding supplement (ENSURE ENLIVE) Liqd Take 237 mLs by mouth 2 (two) times daily between meals.   furosemide 20 MG tablet Commonly known as:  Lasix Take 1 tablet (20 mg total) by mouth every  Tuesday, Thursday, Saturday, and Sunday. For Heart/Fluid Start taking on:  June 26, 2018   Ipratropium-Albuterol 20-100 MCG/ACT Aers respimat Commonly known as:  Combivent Respimat Inhale 1 puff into the lungs every 6 (six) hours as needed for wheezing or shortness of breath. What changed:  Another medication with the same name was  removed. Continue taking this medication, and follow the directions you see here.   levothyroxine 112 MCG tablet Commonly known as:  SYNTHROID, LEVOTHROID Take 112 mcg by mouth every morning.   metoprolol succinate 25 MG 24 hr tablet Commonly known as:  Toprol XL Take 1 tablet (25 mg total) by mouth daily. For Heart What changed:    medication strength  how much to take  additional instructions   multivitamin with minerals Tabs tablet Take 1 tablet by mouth daily.   promethazine 25 MG tablet Commonly known as:  PHENERGAN Take 25 mg by mouth every 6 (six) hours as needed for nausea or vomiting.   traZODone 100 MG tablet Commonly known as:  DESYREL Take 2 tablets (200 mg total) by mouth at bedtime.       Major procedures and Radiology Reports - PLEASE review detailed and final reports for all details, in brief -   Dg Chest 2 View  Result Date: 06/13/2018 CLINICAL DATA:  Weakness, body aches, cough EXAM: CHEST - 2 VIEW COMPARISON:  None. FINDINGS: Pulmonary hyperinflation and probable emphysematous change. No acute abnormality of the lungs. Left chest multi lead pacer defibrillator. Cardiomegaly. Disc degenerative disease of the thoracic spine. IMPRESSION: Pulmonary hyperinflation and probable emphysematous change. No acute appearing airspace opacity. Cardiomegaly. Electronically Signed   By: Lauralyn Primes M.D.   On: 06/13/2018 14:45   Dg Pelvis Portable  Result Date: 06/21/2018 CLINICAL DATA:  Hip abrasion, no known injury EXAM: PORTABLE PELVIS 1-2 VIEWS COMPARISON:  None. FINDINGS: There is no evidence of pelvic fracture or diastasis. No pelvic  bone lesions are seen. IMPRESSION: Negative. Electronically Signed   By: Marlan Palau M.D.   On: 06/21/2018 14:43   Dg Chest Port 1 View  Result Date: 06/19/2018 CLINICAL DATA:  Cough, shortness of breath and chest pain. EXAM: PORTABLE CHEST 1 VIEW COMPARISON:  06/13/2018 FINDINGS: Stable heart size and appearance of dual-chamber pacemaker. Since the prior study, there has been development of mild pulmonary edema. No pleural effusions or focal airspace consolidation. No pneumothorax. Stable chronic lung disease. IMPRESSION: Development of mild pulmonary edema. Electronically Signed   By: Irish Lack M.D.   On: 06/19/2018 20:30    Micro Results    Recent Results (from the past 240 hour(s))  Blood culture (routine x 2)     Status: None   Collection Time: 06/19/18  7:32 PM  Result Value Ref Range Status   Specimen Description BLOOD RIGHT FOREARM  Final   Special Requests   Final    BOTTLES DRAWN AEROBIC AND ANAEROBIC Blood Culture adequate volume   Culture   Final    NO GROWTH 5 DAYS Performed at [facility], [address], [place], [place] [address]    Report Status 06/24/2018 FINAL  Final  Blood culture (routine x 2)     Status: None   Collection Time: 06/19/18  7:37 PM  Result Value Ref Range Status   Specimen Description LEFT ANTECUBITAL  Final   Special Requests   Final    BOTTLES DRAWN AEROBIC AND ANAEROBIC Blood Culture adequate volume   Culture   Final    NO GROWTH 5 DAYS Performed at [facility], [address], [place], [place] [address]    Report Status 06/24/2018 FINAL  Final       Today   Subjective    Anthony Charles today has no new complaints..... No fever  Or chills ,   No Nausea, Vomiting or Diarrhea  Ambulating in  the room without dizziness, palpitations or hypoxia  Orthostatic vitals are stable,       Patient has been seen and examined prior to discharge   Objective   Blood pressure 114/75, pulse (!) 25, temperature 98.6 F (37 C),  temperature source Oral, resp. rate 17, height 5\' 10"  (1.778 m), weight 57.1 kg, SpO2 96 %.   Intake/Output Summary (Last 24 hours) at 06/25/2018 1042 Last data filed at 06/25/2018 0800 Gross per 24 hour  Intake 301.2 ml  Output 1175 ml  Net -873.8 ml    Exam  Gen:- Awake Alert, more relaxed, in no acute distress HEENT:- [place].AT, No sclera icterus Neck-Supple Neck,No JVD,.  Lungs-improving air movement, no wheezing  CV- S1, S2 normal, irregularly irregular, left subclavian area with AICD in situ  abd-  +ve B.Sounds, Abd Soft, No tenderness,    Extremity/Skin:- No  edema, pedal pulses present  Psych-affect is appropriate, oriented x3 Neuro- No new focal deficits, no tremors   Data Review   CBC w Diff:  Lab Results  Component Value Date   WBC 17.8 (H) 06/21/2018   HGB 8.9 (L) 06/21/2018   HCT 27.9 (L) 06/21/2018   PLT 267 06/21/2018   LYMPHOPCT 10 06/20/2018   MONOPCT 7 06/20/2018   EOSPCT 0 06/20/2018   BASOPCT 0 06/20/2018    CMP:  Lab Results  Component Value Date   NA 139 06/24/2018   K 4.6 06/24/2018   CL 102 06/24/2018   CO2 29 06/24/2018   BUN 36 (H) 06/24/2018   CREATININE 1.08 06/24/2018   PROT 5.4 (L) 06/21/2018   ALBUMIN 2.7 (L) 06/21/2018   BILITOT 1.1 06/21/2018   ALKPHOS 70 06/21/2018   AST 28 06/21/2018   ALT 29 06/21/2018  .   Total Discharge time is about 33 minutes  Anthony Charles M.D on 06/25/2018 at 10:42 AM  Go to [email or web address] -  for contact info  [facility] - Office  [phone]

## 2018-06-25 NOTE — Progress Notes (Signed)
Patient received discharge instructions and verbalized understanding. One peripheral IV removed prior to discharge. Patient taken down via wheelchair to be discharged to home.

## 2019-09-27 IMAGING — CR PORTABLE PELVIS 1-2 VIEWS
1 series · 1 of 1 positions shown · non-contrast
Comparison: None.

CLINICAL DATA: Hip abrasion, no known injury

EXAM:
PORTABLE PELVIS 1-2 VIEWS

[ap]
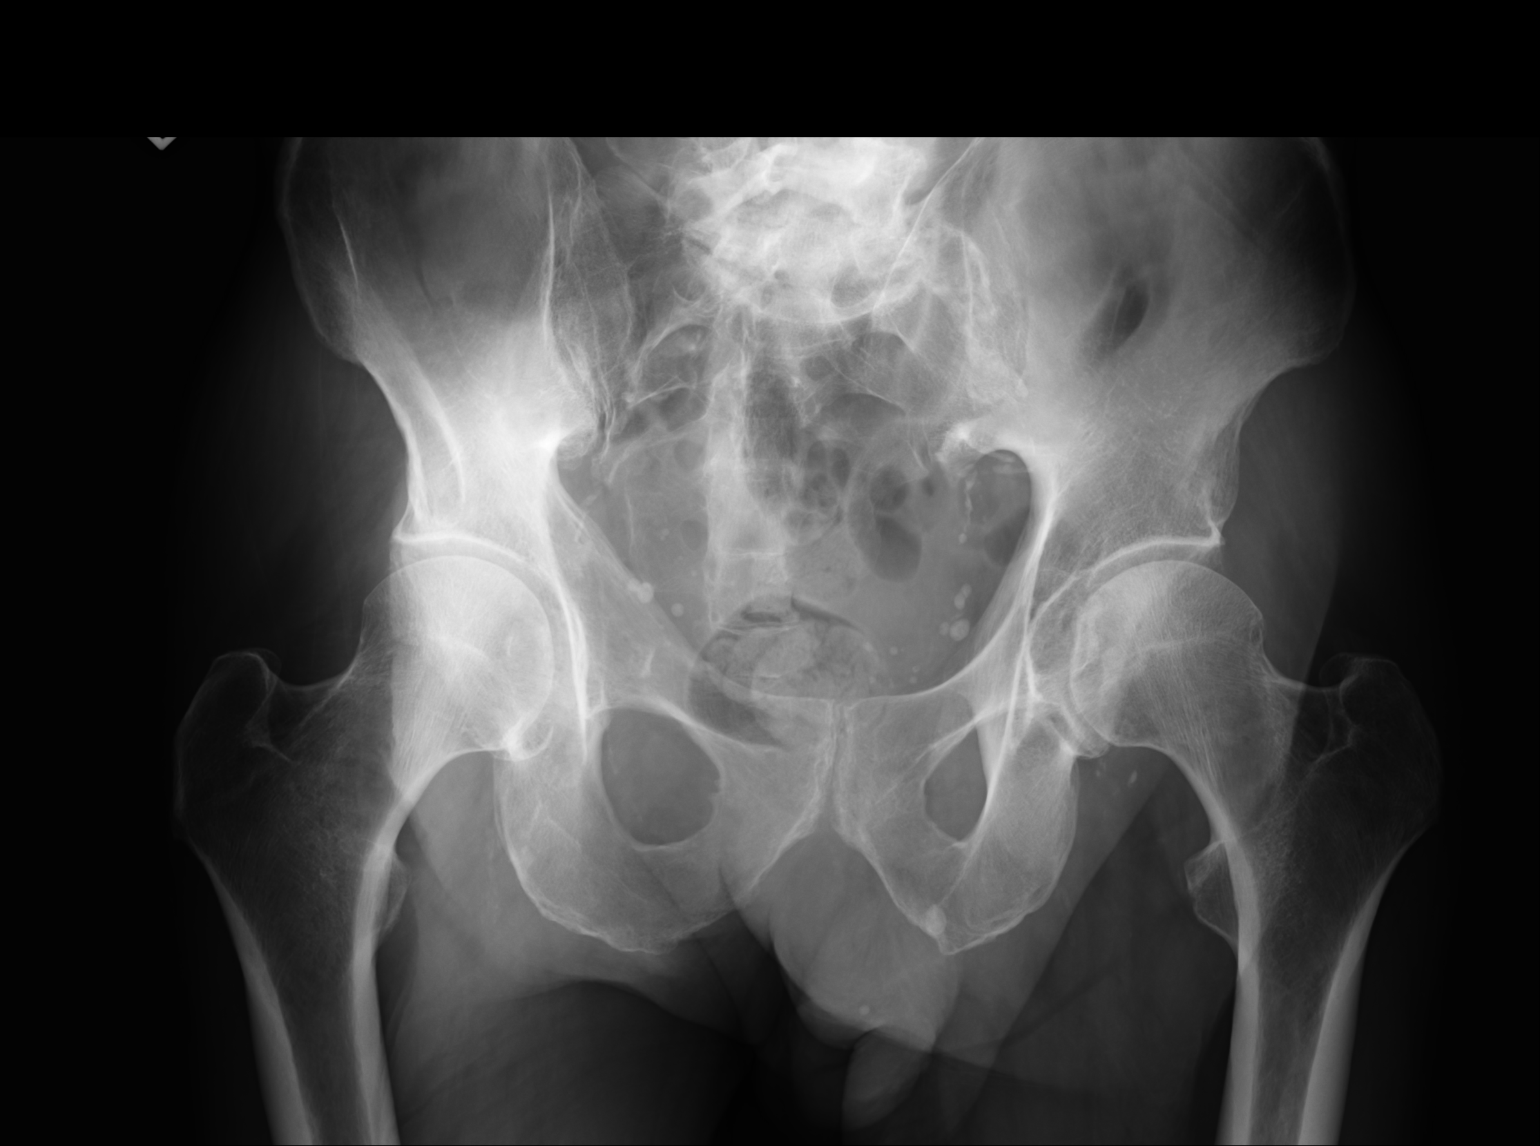

[1 of 1 positions shown; findings below may reference images not displayed]

FINDINGS: There is no evidence of pelvic fracture or diastasis. No pelvic bone
lesions are seen.
IMPRESSION: Negative.

## 2019-10-09 ENCOUNTER — Institutional Professional Consult (permissible substitution): Payer: Non-veteran care | Admitting: Pulmonary Disease

## 2019-11-14 ENCOUNTER — Emergency Department (HOSPITAL_COMMUNITY)
Admission: EM | Admit: 2019-11-14 | Discharge: 2019-11-14 | Disposition: A | Discharge status: Home / Self Care | Payer: No Typology Code available for payment source | Attending: Emergency Medicine | Admitting: Emergency Medicine

## 2019-11-14 ENCOUNTER — Encounter (HOSPITAL_COMMUNITY): Payer: Self-pay | Admitting: *Deleted

## 2019-11-14 ENCOUNTER — Other Ambulatory Visit: Payer: Self-pay

## 2019-11-14 DIAGNOSIS — I2510 Atherosclerotic heart disease of native coronary artery without angina pectoris: Secondary | ICD-10-CM | POA: Diagnosis not present

## 2019-11-14 DIAGNOSIS — Z87891 Personal history of nicotine dependence: Secondary | ICD-10-CM | POA: Diagnosis not present

## 2019-11-14 DIAGNOSIS — J449 Chronic obstructive pulmonary disease, unspecified: Secondary | ICD-10-CM | POA: Insufficient documentation

## 2019-11-14 DIAGNOSIS — E119 Type 2 diabetes mellitus without complications: Secondary | ICD-10-CM | POA: Insufficient documentation

## 2019-11-14 DIAGNOSIS — Z79899 Other long term (current) drug therapy: Secondary | ICD-10-CM | POA: Insufficient documentation

## 2019-11-14 DIAGNOSIS — Z7901 Long term (current) use of anticoagulants: Secondary | ICD-10-CM | POA: Insufficient documentation

## 2019-11-14 DIAGNOSIS — Z79890 Hormone replacement therapy (postmenopausal): Secondary | ICD-10-CM | POA: Diagnosis not present

## 2019-11-14 DIAGNOSIS — E039 Hypothyroidism, unspecified: Secondary | ICD-10-CM | POA: Diagnosis not present

## 2019-11-14 DIAGNOSIS — I5033 Acute on chronic diastolic (congestive) heart failure: Secondary | ICD-10-CM | POA: Diagnosis not present

## 2019-11-14 DIAGNOSIS — R21 Rash and other nonspecific skin eruption: Secondary | ICD-10-CM | POA: Insufficient documentation

## 2019-11-14 LAB — TSH: TSH: 0.29 u[IU]/mL — ABNORMAL LOW (ref 0.350–4.500)

## 2019-11-14 NOTE — ED Triage Notes (Signed)
Pt states with rash to back for a month, denies itching.  Scabbed areas to back noted.  Pt also requests his thyroid to be checked.

## 2019-11-14 NOTE — ED Provider Notes (Signed)
[facility] EMERGENCY DEPARTMENT Provider Note   CSN: [id number] Arrival date & time: 11/14/19  1002     History Chief Complaint  Patient presents with  . Rash    Anthony Charles is a 74 y.o. male.  The history is provided by the patient. No language interpreter was used.  Rash Location:  Torso Torso rash location:  Upper back Quality: dryness   Quality: not itchy, not painful and not red   Severity:  Mild Duration:  2 weeks Progression:  Partially resolved Relieved by:  Nothing Worsened by:  Nothing      Past Medical History:  Diagnosis Date  . Agent orange exposure   . AICD (automatic cardioverter/defibrillator) present   . Aneurysm (HCC)   . Atrial fibrillation (HCC)   . CAD (coronary artery disease)   . Diabetes mellitus without complication (HCC)   . Hernia of abdominal wall   . Thyroid disease     Patient Active Problem List   Diagnosis Date Noted  . Palliative care by specialist   . Goals of care, counseling/discussion   . COPD with acute exacerbation (HCC) 06/20/2018  . Atrial fibrillation with RVR (HCC) 06/19/2018  . Normocytic anemia 06/19/2018  . Hypothyroidism 06/19/2018  . Hyperbilirubinemia 06/19/2018  . Elevated troponin 06/19/2018  . Malnutrition of moderate degree 06/16/2018  . Sepsis (HCC) 06/13/2018  . Influenza A 06/13/2018  . Acute respiratory failure with hypoxia (HCC) 06/13/2018  . CAD in native artery 06/13/2018  . Hx of myocardial infarction 06/13/2018  . H/O agent Orange exposure 06/13/2018  . Protein calorie malnutrition (HCC) 06/13/2018  . Generalized weakness 06/13/2018  . Hypokalemia 06/13/2018  . Acute on chronic diastolic CHF (congestive heart failure) (HCC) 06/13/2018  . Atrial fibrillation, chronic (HCC) 06/13/2018  . 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 pacemaker/defibrillator         Family History   Problem Relation Age of Onset  . CAD Father     Social History   Tobacco Use  . Smoking status: Former Games developer  . Smokeless tobacco: Never Used  Vaping Use  . Vaping Use: Never used  Substance Use Topics  . Alcohol use: Never  . Drug use: Never    Home Medications Prior to Admission medications   Medication Sig Start Date End Date Taking? Authorizing Provider  acetaminophen (TYLENOL) 325 MG tablet Take 2 tablets (650 mg total) by mouth every 4 (four) hours as needed for mild pain, fever or headache. 06/25/18   Shon Hale, MD  ALPRAZolam Prudy Feeler) 0.25 MG tablet Take 1 tablet (0.25 mg total) by mouth 3 (three) times daily as needed for anxiety or sleep. 06/25/18   Shon Hale, MD  amiodarone (PACERONE) 200 MG tablet Take 1 tablet (200 mg total) by mouth 2 (two) times daily. For 7 Days (last dose 07/02/2018), then 200 mg daily starting 07/03/2018........Marland KitchenFor Heart 06/25/18   Shon Hale, MD  apixaban (ELIQUIS) 5 MG TABS tablet Take 1 tablet (5 mg total) by mouth 2 (two) times daily. 06/25/18   Shon Hale, MD  atorvastatin (LIPITOR) 20 MG tablet Take 1 tablet (20 mg total) by mouth at bedtime. 06/25/18   Shon Hale, MD  busPIRone (BUSPAR) 5 MG tablet Take 1 tablet (5 mg total) by mouth 3 (three) times daily. For Anxiety 06/25/18   Shon Hale, MD  cetirizine (ZYRTEC) 10 MG tablet Take 5 mg by mouth daily.    [provider]  feeding supplement, ENSURE ENLIVE, (ENSURE ENLIVE) LIQD Take 237 mLs by mouth 2 (two) times daily between meals. Patient not taking: Reported on 06/19/2018 06/16/18   Maurilio Lovely D, DO  furosemide (LASIX) 20 MG tablet Take 1 tablet (20 mg total) by mouth every Tuesday, Thursday, Saturday, and Sunday. For Heart/Fluid 06/26/18 06/26/19  Emokpae, Courage, MD  Ipratropium-Albuterol (COMBIVENT RESPIMAT) 20-100 MCG/ACT AERS respimat Inhale 1 puff into the lungs every 6 (six) hours as needed for wheezing or shortness of breath. Patient not  taking: Reported on 06/19/2018 06/16/18   Maurilio Lovely D, DO  levothyroxine (SYNTHROID, LEVOTHROID) 112 MCG tablet Take 112 mcg by mouth every morning.    [provider]  metoprolol succinate (TOPROL XL) 25 MG 24 hr tablet Take 1 tablet (25 mg total) by mouth daily. For Heart 06/25/18 06/25/19  Shon Hale, MD  Multiple Vitamin (MULTIVITAMIN WITH MINERALS) TABS tablet Take 1 tablet by mouth daily.    [provider]  promethazine (PHENERGAN) 25 MG tablet Take 25 mg by mouth every 6 (six) hours as needed for nausea or vomiting.    [provider]  traZODone (DESYREL) 100 MG tablet Take 2 tablets (200 mg total) by mouth at bedtime. 06/25/18   Shon Hale, MD    Allergies    Patient has no known allergies.  Review of Systems   Review of Systems  Skin: Positive for rash.  All other systems reviewed and are negative.   Physical Exam Updated Vital Signs BP (!) 146/95   Pulse 74   Temp 97.9 F (36.6 C) (Oral)   Resp 20   Ht 5\' 10"  (1.778 m)   Wt 61.2 kg   SpO2 94%   BMI 19.37 kg/m   Physical Exam Vitals reviewed.  Cardiovascular:     Rate and Rhythm: Normal rate.  Pulmonary:     Effort: Pulmonary effort is normal.  Skin:    General: Skin is warm.     Comments: Dried scabbed areas upper right back,  Appears to be clearing.   Neurological:     General: No focal deficit present.     Mental Status: He is alert.  Psychiatric:        Mood and Affect: Mood normal.     ED Results / Procedures / Treatments   Labs (all labs ordered are listed, but only abnormal results are displayed) Labs Reviewed  TSH - Abnormal; Notable for the following components:      Result Value   TSH 0.290 (*)    All other components within normal limits    EKG None  Radiology No results found.  Procedures Procedures (including critical care time)  Medications Ordered in ED Medications - No data to display  ED Course  I have reviewed the triage vital signs  and the nursing notes.  Pertinent labs & imaging results that were available during my care of the patient were reviewed by me and considered in my medical decision making (see chart for details).    MDM Rules/Calculators/A&P                          MDM:  Pt wants thyroid level tested.  Pt counseled on my chart and advised to follow up at the [facility] hospital  Final Clinical Impression(s) / ED Diagnoses Final diagnoses:  Rash    Rx / DC Orders ED Discharge Orders    None    An After Visit Summary  was printed and given to the patient.    Elson Areas, [place] 11/14/19 1436    Sabas Sous, MD 11/14/19 1535

## 2019-11-14 NOTE — Discharge Instructions (Signed)
Use topical antibiotic on rash.  The rash appears to be clearing.  Your thyroid test is pending.  Follow up with your Physician for medication adjustments

## 2019-12-03 ENCOUNTER — Other Ambulatory Visit: Payer: Self-pay

## 2019-12-03 ENCOUNTER — Ambulatory Visit (INDEPENDENT_AMBULATORY_CARE_PROVIDER_SITE_OTHER): Payer: No Typology Code available for payment source | Admitting: Pulmonary Disease

## 2019-12-03 ENCOUNTER — Encounter: Payer: Self-pay | Admitting: Pulmonary Disease

## 2019-12-03 DIAGNOSIS — F5102 Adjustment insomnia: Secondary | ICD-10-CM | POA: Diagnosis not present

## 2019-12-03 DIAGNOSIS — R21 Rash and other nonspecific skin eruption: Secondary | ICD-10-CM

## 2019-12-03 DIAGNOSIS — G4700 Insomnia, unspecified: Secondary | ICD-10-CM | POA: Insufficient documentation

## 2019-12-03 NOTE — Assessment & Plan Note (Signed)
This could be prurigo or related to drugs He will discuss with PCP at the [facility]

## 2019-12-03 NOTE — Assessment & Plan Note (Signed)
He had insomnia of sleep maintenance which may have been due to an adjustment disorder.  He reports stressors including having to move. This is now resolved with trazodone.  He does not have any red flags for obstructive sleep apnea or leg movement disorder.  As such I do not think that a sleep study is necessary at this time. He will attempt to lower dose of trazodone to 100 mg  We discussed rules of sleep hygiene

## 2019-12-03 NOTE — Patient Instructions (Signed)
OK to stay on trazodone No need for sleep study at this time

## 2019-12-03 NOTE — Progress Notes (Signed)
Subjective:    Patient ID: Anthony Charles, male    DOB: July 25, 1945, 74 y.o.   MRN: [id number]  HPI   74 year old [place] veteran referred for evaluation of excessive daytime fatigue and trouble staying asleep. He states that he had the symptoms about 6 months ago and discussed this on a virtual visit with the [place].  He was placed on trazodone and now his symptoms are much improved he takes 200 mg of trazodone around 10:30 PM, bedtime is around 11 PM, sleep latency is 30 minutes, he sleeps on his right side with 2 pillows, reports 3-4 nocturnal awakenings including nocturia and is out of bed at 7 AM feeling rested without dryness of mouth or headaches. Weight is unchanged. Epworth sleepiness score is 0  He developed a rash on his back for which she went to the emergency room and he would like my opinion about this.  No bed partner history is available but he denies snoring or excessive daytime somnolence  PMH -atrial fibrillation on amiodarone and apixaban, status post AICD , he has had multiple ICD discharges, CAD, exposure to agent orange  Labs reviewed from the records, last hemoglobin 9.8 05/2019   Past Medical History:  Diagnosis Date  . Agent orange exposure   . AICD (automatic cardioverter/defibrillator) present   . Aneurysm (HCC)   . Atrial fibrillation (HCC)   . CAD (coronary artery disease)   . Diabetes mellitus without complication (HCC)   . Hernia of abdominal wall   . Hypertension   . Thyroid disease     Past Surgical History:  Procedure Laterality Date  . pacemaker/defibrillator      No Known Allergies  Social History   Socioeconomic History  . Marital status: Married    Spouse name: Not on file  . Number of children: Not on file  . Years of education: Not on file  . Highest education level: Not on file  Occupational History  . Not on file  Tobacco Use  . Smoking status: Former Smoker    Packs/day: 1.00    Years: 30.00    Pack years: 30.00    Types:  Cigarettes    Quit date: 1989    Years since quitting: 32.6  . Smokeless tobacco: Never Used  . Tobacco comment: smokes a cigar 2 times per week  Vaping Use  . Vaping Use: Never used  Substance and Sexual Activity  . Alcohol use: Never  . Drug use: Never  . Sexual activity: Never  Other Topics Concern  . Not on file  Social History Narrative  . Not on file   Social Determinants of Health   Financial Resource Strain:   . Difficulty of Paying Living Expenses: Not on file  Food Insecurity:   . Worried About Programme researcher, broadcasting/film/video in the Last Year: Not on file  . Ran Out of Food in the Last Year: Not on file  Transportation Needs:   . Lack of Transportation (Medical): Not on file  . Lack of Transportation (Non-Medical): Not on file  Physical Activity:   . Days of Exercise per Week: Not on file  . Minutes of Exercise per Session: Not on file  Stress:   . Feeling of Stress : Not on file  Social Connections:   . Frequency of Communication with Friends and Family: Not on file  . Frequency of Social Gatherings with Friends and Family: Not on file  . Attends Religious Services: Not on file  . Active Member  of Clubs or Organizations: Not on file  . Attends Banker Meetings: Not on file  . Marital Status: Not on file  Intimate Partner Violence:   . Fear of Current or Ex-Partner: Not on file  . Emotionally Abused: Not on file  . Physically Abused: Not on file  . Sexually Abused: Not on file     Family History  Problem Relation Age of Onset  . CAD Father     Review of Systems Shortness of breath with activity Nasal congestion    Objective:   Physical Exam  Gen. Pleasant, thin, in no distress ENT - no thrush, mild pallor no icterus,no post nasal drip Neck: No JVD, no thyromegaly, no carotid bruits Lungs: no use of accessory muscles, no dullness to percussion, clear without rales or rhonchi  Cardiovascular: Rhythm regular, heart sounds  normal, no murmurs or  gallops, no peripheral edema Musculoskeletal: No deformities, no cyanosis or clubbing  Bruising over hands, rash over back, dry ? prurigo      Assessment & Plan:

## 2019-12-19 ENCOUNTER — Encounter (HOSPITAL_COMMUNITY): Payer: Self-pay | Admitting: *Deleted

## 2019-12-19 ENCOUNTER — Other Ambulatory Visit: Payer: Self-pay

## 2019-12-19 ENCOUNTER — Emergency Department (HOSPITAL_COMMUNITY)
Admission: EM | Admit: 2019-12-19 | Discharge: 2019-12-20 | Disposition: A | Discharge status: Home / Self Care | Payer: No Typology Code available for payment source | Attending: Emergency Medicine | Admitting: Emergency Medicine

## 2019-12-19 DIAGNOSIS — I5033 Acute on chronic diastolic (congestive) heart failure: Secondary | ICD-10-CM | POA: Insufficient documentation

## 2019-12-19 DIAGNOSIS — Z87891 Personal history of nicotine dependence: Secondary | ICD-10-CM | POA: Insufficient documentation

## 2019-12-19 DIAGNOSIS — I110 Hypertensive heart disease with heart failure: Secondary | ICD-10-CM | POA: Diagnosis not present

## 2019-12-19 DIAGNOSIS — Z950 Presence of cardiac pacemaker: Secondary | ICD-10-CM | POA: Insufficient documentation

## 2019-12-19 DIAGNOSIS — Z7901 Long term (current) use of anticoagulants: Secondary | ICD-10-CM | POA: Insufficient documentation

## 2019-12-19 DIAGNOSIS — D649 Anemia, unspecified: Secondary | ICD-10-CM | POA: Diagnosis not present

## 2019-12-19 DIAGNOSIS — I2510 Atherosclerotic heart disease of native coronary artery without angina pectoris: Secondary | ICD-10-CM | POA: Insufficient documentation

## 2019-12-19 DIAGNOSIS — J441 Chronic obstructive pulmonary disease with (acute) exacerbation: Secondary | ICD-10-CM | POA: Diagnosis not present

## 2019-12-19 DIAGNOSIS — R609 Edema, unspecified: Secondary | ICD-10-CM | POA: Insufficient documentation

## 2019-12-19 DIAGNOSIS — Z79899 Other long term (current) drug therapy: Secondary | ICD-10-CM | POA: Insufficient documentation

## 2019-12-19 DIAGNOSIS — Z79890 Hormone replacement therapy (postmenopausal): Secondary | ICD-10-CM | POA: Diagnosis not present

## 2019-12-19 DIAGNOSIS — E119 Type 2 diabetes mellitus without complications: Secondary | ICD-10-CM | POA: Insufficient documentation

## 2019-12-19 LAB — CBC WITH DIFFERENTIAL/PLATELET
Abs Immature Granulocytes: 0.02 10*3/uL (ref 0.00–0.07)
Basophils Absolute: 0 10*3/uL (ref 0.0–0.1)
Basophils Relative: 0 %
Eosinophils Absolute: 0.1 10*3/uL (ref 0.0–0.5)
Eosinophils Relative: 3 %
HCT: 25.4 % — ABNORMAL LOW (ref 39.0–52.0)
Hemoglobin: 7.3 g/dL — ABNORMAL LOW (ref 13.0–17.0)
Immature Granulocytes: 0 %
Lymphocytes Relative: 22 %
Lymphs Abs: 1.1 10*3/uL (ref 0.7–4.0)
MCH: 27.7 pg (ref 26.0–34.0)
MCHC: 28.7 g/dL — ABNORMAL LOW (ref 30.0–36.0)
MCV: 96.2 fL (ref 80.0–100.0)
Monocytes Absolute: 0.8 10*3/uL (ref 0.1–1.0)
Monocytes Relative: 14 %
Neutro Abs: 3.2 10*3/uL (ref 1.7–7.7)
Neutrophils Relative %: 61 %
Platelets: 223 10*3/uL (ref 150–400)
RBC: 2.64 MIL/uL — ABNORMAL LOW (ref 4.22–5.81)
RDW: 16.1 % — ABNORMAL HIGH (ref 11.5–15.5)
WBC: 5.2 10*3/uL (ref 4.0–10.5)
nRBC: 0 % (ref 0.0–0.2)

## 2019-12-19 LAB — PROTIME-INR
INR: 1.5 — ABNORMAL HIGH (ref 0.8–1.2)
Prothrombin Time: 17.8 seconds — ABNORMAL HIGH (ref 11.4–15.2)

## 2019-12-19 LAB — PREPARE RBC (CROSSMATCH)

## 2019-12-19 LAB — BASIC METABOLIC PANEL
Anion gap: 6 (ref 5–15)
BUN: 19 mg/dL (ref 8–23)
CO2: 27 mmol/L (ref 22–32)
Calcium: 8.3 mg/dL — ABNORMAL LOW (ref 8.9–10.3)
Chloride: 107 mmol/L (ref 98–111)
Creatinine, Ser: 1.01 mg/dL (ref 0.61–1.24)
GFR calc Af Amer: >60 mL/min (ref >60)
GFR calc non Af Amer: >60 mL/min (ref >60)
Glucose, Bld: 108 mg/dL — ABNORMAL HIGH (ref 70–99)
Potassium: 4 mmol/L (ref 3.5–5.1)
Sodium: 140 mmol/L (ref 135–145)

## 2019-12-19 LAB — ABO/RH: ABO/RH(D): A NEG

## 2019-12-19 MED ORDER — SODIUM CHLORIDE 0.9 % IV SOLN
10.0000 mL/h | Freq: Once | INTRAVENOUS | Status: AC
  Administered 2019-12-20: 10 mL/h via INTRAVENOUS

## 2019-12-19 MED ORDER — FUROSEMIDE 10 MG/ML IJ SOLN
40.0000 mg | INTRAMUSCULAR | Status: AC
  Administered 2019-12-19: 40 mg via INTRAVENOUS
  Filled 2019-12-19: qty 4

## 2019-12-19 NOTE — Discharge Instructions (Signed)
[facility] you will need to have your blood redrawn within the week.  They may need to refer you to a gastroenterologist for further evaluation about why you are anemic however you did not have any blood in your stool tonight.  If you see severe or worsening blood in your stools are you having any abdominal pain that gets worse lightheadedness or shortness of breath come back to the emergency department immediately.

## 2019-12-19 NOTE — ED Provider Notes (Signed)
[facility] EMERGENCY DEPARTMENT Provider Note   CSN: [id number] Arrival date & time: 12/19/19  1253     History Chief Complaint  Patient presents with  . Anemia    Reshard Guillet is a 74 y.o. male.  HPI   74 y/o male - hx of afib and AICD, DM, on eliquis.  Has had a lab of Hgb of 7.5 or so - hx of chronic anemia - was told by his primary care physician at the [facility] that he was anemic, told to come to the hospital to get a blood transfusion.  Again the patient is not symptomatic, there is no shortness of breath, lightheadedness, nausea or vomiting, no abdominal pain and he has not noticed any blood in his stools.  He does not recall having any iron transfusions in the past but he has been on some oral iron supplements in the past.  Past Medical History:  Diagnosis Date  . Agent orange exposure   . AICD (automatic cardioverter/defibrillator) present   . Aneurysm (HCC)   . Atrial fibrillation (HCC)   . CAD (coronary artery disease)   . Diabetes mellitus without complication (HCC)   . Hernia of abdominal wall   . Hypertension   . Thyroid disease     Patient Active Problem List   Diagnosis Date Noted  . Insomnia 12/03/2019  . Rash of back 12/03/2019  . Palliative care by specialist   . Goals of care, counseling/discussion   . COPD with acute exacerbation (HCC) 06/20/2018  . Atrial fibrillation with RVR (HCC) 06/19/2018  . Normocytic anemia 06/19/2018  . Hypothyroidism 06/19/2018  . Hyperbilirubinemia 06/19/2018  . Elevated troponin 06/19/2018  . Malnutrition of moderate degree 06/16/2018  . CAD in native artery 06/13/2018  . Hx of myocardial infarction 06/13/2018  . H/O agent Orange exposure 06/13/2018  . Protein calorie malnutrition (HCC) 06/13/2018  . Generalized weakness 06/13/2018  . Acute on chronic diastolic CHF (congestive heart failure) (HCC) 06/13/2018  . Atrial fibrillation, chronic (HCC) 06/13/2018  . Presence of biventricular automatic implantable cardioverter  defibrillator 06/13/2018    Past Surgical History:  Procedure Laterality Date  . pacemaker/defibrillator         Family History  Problem Relation Age of Onset  . CAD Father     Social History   Tobacco Use  . Smoking status: Former Smoker    Packs/day: 1.00    Years: 30.00    Pack years: 30.00    Types: Cigarettes    Quit date: 1989    Years since quitting: 32.7  . Smokeless tobacco: Never Used  . Tobacco comment: smokes a cigar 2 times per week  Vaping Use  . Vaping Use: Never used  Substance Use Topics  . Alcohol use: Never  . Drug use: Never    Home Medications Prior to Admission medications   Medication Sig Start Date End Date Taking? Authorizing Provider  acetaminophen (TYLENOL) 325 MG tablet Take 2 tablets (650 mg total) by mouth every 4 (four) hours as needed for mild pain, fever or headache. 06/25/18   Shon Hale, MD  ALPRAZolam Prudy Feeler) 0.25 MG tablet Take 1 tablet (0.25 mg total) by mouth 3 (three) times daily as needed for anxiety or sleep. 06/25/18   Shon Hale, MD  amiodarone (PACERONE) 200 MG tablet Take 1 tablet (200 mg total) by mouth 2 (two) times daily. For 7 Days (last dose 07/02/2018), then 200 mg daily starting 07/03/2018........Marland KitchenFor Heart 06/25/18   Shon Hale, MD  apixaban Everlene Balls)  5 MG TABS tablet Take 1 tablet (5 mg total) by mouth 2 (two) times daily. 06/25/18   Shon Hale, MD  atorvastatin (LIPITOR) 20 MG tablet Take 1 tablet (20 mg total) by mouth at bedtime. 06/25/18   Shon Hale, MD  busPIRone (BUSPAR) 5 MG tablet Take 1 tablet (5 mg total) by mouth 3 (three) times daily. For Anxiety 06/25/18   Shon Hale, MD  cetirizine (ZYRTEC) 10 MG tablet Take 5 mg by mouth daily.    [provider]  feeding supplement, ENSURE ENLIVE, (ENSURE ENLIVE) LIQD Take 237 mLs by mouth 2 (two) times daily between meals. 06/16/18   Sherryll Burger, Pratik D, DO  furosemide (LASIX) 20 MG tablet Take 1 tablet (20 mg total) by mouth every  Tuesday, Thursday, Saturday, and Sunday. For Heart/Fluid 06/26/18 12/03/19  Emokpae, Courage, MD  Ipratropium-Albuterol (COMBIVENT RESPIMAT) 20-100 MCG/ACT AERS respimat Inhale 1 puff into the lungs every 6 (six) hours as needed for wheezing or shortness of breath. Patient not taking: Reported on 06/19/2018 06/16/18   Maurilio Lovely D, DO  levothyroxine (SYNTHROID, LEVOTHROID) 112 MCG tablet Take 112 mcg by mouth every morning.    [provider]  metoprolol succinate (TOPROL XL) 25 MG 24 hr tablet Take 1 tablet (25 mg total) by mouth daily. For Heart 06/25/18 12/03/19  Shon Hale, MD  Multiple Vitamin (MULTIVITAMIN WITH MINERALS) TABS tablet Take 1 tablet by mouth daily.    [provider]  promethazine (PHENERGAN) 25 MG tablet Take 25 mg by mouth every 6 (six) hours as needed for nausea or vomiting.    [provider]  traZODone (DESYREL) 100 MG tablet Take 2 tablets (200 mg total) by mouth at bedtime. 06/25/18   Shon Hale, MD    Allergies    Patient has no known allergies.  Review of Systems   Review of Systems  All other systems reviewed and are negative.   Physical Exam Updated Vital Signs BP (!) 147/100   Pulse 82   Temp 98.6 F (37 C) (Oral)   Resp 18   Ht 1.778 m (5\' 10" )   Wt 61.7 kg   SpO2 96%   BMI 19.51 kg/m   Physical Exam Vitals and nursing note reviewed.  Constitutional:      General: He is not in acute distress.    Appearance: He is well-developed.  HENT:     Head: Normocephalic and atraumatic.     Mouth/Throat:     Pharynx: No oropharyngeal exudate.  Eyes:     General: No scleral icterus.       Right eye: No discharge.        Left eye: No discharge.     Conjunctiva/sclera: Conjunctivae normal.     Pupils: Pupils are equal, round, and reactive to light.  Neck:     Thyroid: No thyromegaly.     Vascular: No JVD.  Cardiovascular:     Rate and Rhythm: Normal rate and regular rhythm.     Heart sounds: Normal heart sounds. No  murmur heard.  No friction rub. No gallop.   Pulmonary:     Effort: Pulmonary effort is normal. No respiratory distress.     Breath sounds: Normal breath sounds. No wheezing or rales.  Abdominal:     General: Bowel sounds are normal. There is no distension.     Palpations: Abdomen is soft. There is no mass.     Tenderness: There is no abdominal tenderness.  Musculoskeletal:  General: No tenderness. Normal range of motion.     Cervical back: Normal range of motion and neck supple.     Right lower leg: Edema present.     Left lower leg: Edema present.  Lymphadenopathy:     Cervical: No cervical adenopathy.  Skin:    General: Skin is warm and dry.     Findings: No erythema or rash.  Neurological:     Mental Status: He is alert.     Coordination: Coordination normal.  Psychiatric:        Behavior: Behavior normal.     ED Results / Procedures / Treatments   Labs (all labs ordered are listed, but only abnormal results are displayed) Labs Reviewed  CBC WITH DIFFERENTIAL/PLATELET - Abnormal; Notable for the following components:      Result Value   RBC 2.64 (*)    Hemoglobin 7.3 (*)    HCT 25.4 (*)    MCHC 28.7 (*)    RDW 16.1 (*)    All other components within normal limits  BASIC METABOLIC PANEL - Abnormal; Notable for the following components:   Glucose, Bld 108 (*)    Calcium 8.3 (*)    All other components within normal limits  PROTIME-INR - Abnormal; Notable for the following components:   Prothrombin Time 17.8 (*)    INR 1.5 (*)    All other components within normal limits  TYPE AND SCREEN  ABO/RH  PREPARE RBC (CROSSMATCH)    EKG None  Radiology No results found.  Procedures Procedures (including critical care time)  Medications Ordered in ED Medications  0.9 %  sodium chloride infusion (has no administration in time range)  furosemide (LASIX) injection 40 mg (40 mg Intravenous Given 12/19/19 2208)    ED Course  I have reviewed the triage vital  signs and the nursing notes.  Pertinent labs & imaging results that were available during my care of the patient were reviewed by me and considered in my medical decision making (see chart for details).    MDM Rules/Calculators/A&P                          The patient appears benign, his vital signs are normal with a blood pressure of 118/76, temperature of 98.6, pulse of 70, respirations of 18 and oxygen of 96%.  He has had a progressive anemia, when I look at his prior labs, as below he was most recently a year and a half ago 8.9 thus he has lost 1.5 units of blood in that timeframe.  Of note his MCV is 96.2, he has a normocytic anemia, his platelet count is 223 which is similar to prior values.  We will proceed with blood transfusion, check for lower GI bleed or any blood in his stool.  Symmetrical leg swelling, no SOB, lasix ordered  Hemoglobin  Date Value Ref Range Status  12/19/2019 7.3 (L) 13.0 - 17.0 g/dL Final  78/46/962903/19/2020 8.9 (L) 13.0 - 17.0 g/dL Final  52/84/132403/18/2020 8.8 (L) 13.0 - 17.0 g/dL Final  40/10/272503/17/2020 9.5 (L) 13.0 - 17.0 g/dL Final   The patient has already had Lasix called into his pharmacy from the clinic that he went to today.  He was given 1 unit of packed red blood cells, he tolerated this well, vital signs remain normal, he is stable for discharge and willing to follow-up in the outpatient setting  Final Clinical Impression(s) / ED Diagnoses Final diagnoses:  Chronic  anemia      Eber Hong, MD 12/19/19 2324

## 2019-12-19 NOTE — ED Triage Notes (Signed)
Pt seen at [facility] yesterday and had blood work done, nurse called this am and said hgb 7.

## 2019-12-20 LAB — TYPE AND SCREEN
ABO/RH(D): A NEG
Antibody Screen: NEGATIVE
Unit division: 0

## 2019-12-20 LAB — BPAM RBC
Blood Product Expiration Date: 202109252359
ISSUE DATE / TIME: 202109162348
Unit Type and Rh: 600

## 2019-12-20 NOTE — ED Notes (Signed)
Pt given warm blanket, resting comfortably at this time.

## 2021-03-22 ENCOUNTER — Other Ambulatory Visit (HOSPITAL_COMMUNITY): Payer: Self-pay | Admitting: Family Medicine

## 2021-03-22 DIAGNOSIS — N644 Mastodynia: Secondary | ICD-10-CM

## 2022-08-09 ENCOUNTER — Other Ambulatory Visit: Payer: Self-pay

## 2022-08-09 ENCOUNTER — Emergency Department (HOSPITAL_COMMUNITY)
Admission: EM | Admit: 2022-08-09 | Discharge: 2022-08-09 | Disposition: A | Discharge status: Home / Self Care | Payer: No Typology Code available for payment source | Attending: Emergency Medicine | Admitting: Emergency Medicine

## 2022-08-09 ENCOUNTER — Ambulatory Visit: Payer: Self-pay

## 2022-08-09 ENCOUNTER — Encounter (HOSPITAL_COMMUNITY): Payer: Self-pay | Admitting: Emergency Medicine

## 2022-08-09 ENCOUNTER — Emergency Department (HOSPITAL_COMMUNITY): Payer: No Typology Code available for payment source

## 2022-08-09 DIAGNOSIS — G479 Sleep disorder, unspecified: Secondary | ICD-10-CM | POA: Diagnosis present

## 2022-08-09 DIAGNOSIS — Z7901 Long term (current) use of anticoagulants: Secondary | ICD-10-CM | POA: Diagnosis not present

## 2022-08-09 DIAGNOSIS — I509 Heart failure, unspecified: Secondary | ICD-10-CM | POA: Insufficient documentation

## 2022-08-09 DIAGNOSIS — Z72820 Sleep deprivation: Secondary | ICD-10-CM

## 2022-08-09 LAB — BASIC METABOLIC PANEL
Anion gap: 8 (ref 5–15)
BUN: 17 mg/dL (ref 8–23)
CO2: 30 mmol/L (ref 22–32)
Calcium: 8.4 mg/dL — ABNORMAL LOW (ref 8.9–10.3)
Chloride: 97 mmol/L — ABNORMAL LOW (ref 98–111)
Creatinine, Ser: 1.13 mg/dL (ref 0.61–1.24)
GFR, Estimated: >60 mL/min (ref >60)
Glucose, Bld: 113 mg/dL — ABNORMAL HIGH (ref 70–99)
Potassium: 3.7 mmol/L (ref 3.5–5.1)
Sodium: 135 mmol/L (ref 135–145)

## 2022-08-09 LAB — DIGOXIN LEVEL: Digoxin Level: 1 ng/mL (ref 0.8–2.0)

## 2022-08-09 LAB — MAGNESIUM: Magnesium: 2.1 mg/dL (ref 1.7–2.4)

## 2022-08-09 MED ORDER — ALBUTEROL SULFATE HFA 108 (90 BASE) MCG/ACT IN AERS: 2.0000 | INHALATION_SPRAY | RESPIRATORY_TRACT | Status: DC | PRN

## 2022-08-09 MED ORDER — FUROSEMIDE 10 MG/ML IJ SOLN
20.0000 mg | Freq: Once | INTRAMUSCULAR | Status: AC
  Administered 2022-08-09: 20 mg via INTRAVENOUS
  Filled 2022-08-09: qty 2

## 2022-08-09 MED ORDER — METOPROLOL SUCCINATE ER 50 MG PO TB24
50.0000 mg | ORAL_TABLET | Freq: Every day | ORAL | Status: DC
  Administered 2022-08-09: 50 mg via ORAL
  Filled 2022-08-09: qty 1

## 2022-08-09 NOTE — ED Notes (Signed)
EKG completed w/critical result elongated QTC: sent to Dr. Durwin Nora for review.

## 2022-08-09 NOTE — Discharge Instructions (Signed)
Increase your home torsemide dose to 20 mg daily.  This will be 1 full tablet.  This should help alleviate the fluid from your lungs and improve your breathing.  Improving your breathing may help with sleep.  Follow-up with your PCP and cardiologist to discuss further changes to your home medications.  Return to the emergency department at anytime for any new or worsening symptoms of concern.

## 2022-08-09 NOTE — Telephone Encounter (Signed)
  Chief Complaint: insomnia Symptoms: insomnia, no sleep in 12 days, SOB with exertion Frequency: 12 days or more Pertinent Negatives: Patient denies pain or stress related sx  Disposition: [] ED /[x] Urgent Care (no appt availability in office) / [] Appointment(In office/virtual)/ []  [facility]/ [] Home Care/ [] Refused Recommended Disposition /[] [facility]/ []  Follow-up with PCP Additional Notes: pt went to [facility] ED today and was dc with no advice. At first pt was told he was going to be admitted and then he got dc'd with no further recommendations. Pt has been to PCP for this and prescribed several different meds but none work. [facility]. Advised pt and friend he could go to [facility] or ED if wanted. Christen Bame, Friend asked about pt going to [facility] since they were told [facility] wouldn't help him either. Advised that he wouldn't deny him care if he wanted to go. No further assistance needed  Reason for Disposition  [1] Insomnia persists > 1 week AND [2] no improvement after using Care Advice  Answer Assessment - Initial Assessment Questions 1. DESCRIPTION: "Tell me about your sleeping problem."      Not able to go to sleep  2. ONSET: "How long have you been having trouble sleeping?" (e.g., days, weeks, months)     12 days  3. RECURRENT: "Have you had sleeping problems before?"  If Yes, ask: "What happened that time?" "What helped your sleeping problem go away in the past?"      Has has several medications for sleep but not helped  4. STRESS: "Is there anything in your life that is making you feel stressed or tense?"     no 5. PAIN: "Do you have any pain that is keeping you awake?" (e.g., back pain, headache, abdomen pain)     no 6. CAFFEINE ABUSE: "Do you drink caffeinated beverages, and how much each day?" (e.g., coffee, tea, colas)     Coffee in mornings 7. ALCOHOL USE OR SUBSTANCE USE (DRUG USE): "Do you drink alcohol or use any illegal drugs?"     no 8. OTHER  SYMPTOMS: "Do you have any other symptoms?"  (e.g., difficulty breathing)     SOB with exertion  Protocols used: Insomnia-A-AH

## 2022-08-09 NOTE — ED Provider Notes (Signed)
[place] EMERGENCY DEPARTMENT AT [facility] Provider Note   CSN: [id number] Arrival date & time: 08/09/22  1534     History  Chief Complaint  Patient presents with   Shortness of Breath   Insomnia    Anthony Charles is a 77 y.o. male.   Shortness of Breath Patient presents for difficulty sleeping.  He states that this has been ongoing for the past several weeks.  He has been seen on multiple ED visits as well as by PCP at the [place].  He initially tried some potassium supplement and melatonin which worked well for him for 1 night.  After that, and no longer work.  He has tried Vistaril without success.  Currently, patient has not taken anything at bedtime to help with his sleep.  He does state that he will get very short amounts of sleep.  He tries to not to nod off during the day to help with his sleep hygiene at night.  Additional recent symptoms include exertional shortness of breath.  Patient does take Lasix daily.  Patient was seen at [facility] earlier today.  He feels that he was treated poorly while there.     Home Medications Prior to Admission medications   Medication Sig Start Date End Date Taking? Authorizing Provider  acetaminophen (TYLENOL) 325 MG tablet Take 2 tablets (650 mg total) by mouth every 4 (four) hours as needed for mild pain, fever or headache. 06/25/18   Shon Hale, MD  ALPRAZolam Prudy Feeler) 0.25 MG tablet Take 1 tablet (0.25 mg total) by mouth 3 (three) times daily as needed for anxiety or sleep. 06/25/18   Shon Hale, MD  amiodarone (PACERONE) 200 MG tablet Take 1 tablet (200 mg total) by mouth 2 (two) times daily. For 7 Days (last dose 07/02/2018), then 200 mg daily starting 07/03/2018........Marland KitchenFor Heart 06/25/18   Shon Hale, MD  apixaban (ELIQUIS) 5 MG TABS tablet Take 1 tablet (5 mg total) by mouth 2 (two) times daily. 06/25/18   Shon Hale, MD  atorvastatin (LIPITOR) 20 MG tablet Take 1 tablet (20 mg total) by mouth at bedtime.  06/25/18   Shon Hale, MD  busPIRone (BUSPAR) 5 MG tablet Take 1 tablet (5 mg total) by mouth 3 (three) times daily. For Anxiety 06/25/18   Shon Hale, MD  cetirizine (ZYRTEC) 10 MG tablet Take 5 mg by mouth daily.    [provider]  feeding supplement, ENSURE ENLIVE, (ENSURE ENLIVE) LIQD Take 237 mLs by mouth 2 (two) times daily between meals. 06/16/18   Sherryll Burger, Pratik D, DO  furosemide (LASIX) 20 MG tablet Take 1 tablet (20 mg total) by mouth every Tuesday, Thursday, Saturday, and Sunday. For Heart/Fluid 06/26/18 12/03/19  Emokpae, Courage, MD  Ipratropium-Albuterol (COMBIVENT RESPIMAT) 20-100 MCG/ACT AERS respimat Inhale 1 puff into the lungs every 6 (six) hours as needed for wheezing or shortness of breath. Patient not taking: Reported on 06/19/2018 06/16/18   Maurilio Lovely D, DO  levothyroxine (SYNTHROID, LEVOTHROID) 112 MCG tablet Take 112 mcg by mouth every morning.    [provider]  metoprolol succinate (TOPROL XL) 25 MG 24 hr tablet Take 1 tablet (25 mg total) by mouth daily. For Heart 06/25/18 12/03/19  Shon Hale, MD  Multiple Vitamin (MULTIVITAMIN WITH MINERALS) TABS tablet Take 1 tablet by mouth daily.    [provider]  promethazine (PHENERGAN) 25 MG tablet Take 25 mg by mouth every 6 (six) hours as needed for nausea or vomiting.    [provider]  traZODone (DESYREL) 100 MG tablet Take 2 tablets (200 mg total) by mouth at bedtime. 06/25/18   Shon Hale, MD      Allergies    Patient has no known allergies.    Review of Systems   Review of Systems  Respiratory:  Positive for shortness of breath.   Psychiatric/Behavioral:  Positive for sleep disturbance.   All other systems reviewed and are negative.   Physical Exam Updated Vital Signs BP 92/65   Pulse 98   Temp 98.2 F (36.8 C) (Oral)   Resp 18   Ht 5\' 10"  (1.778 m)   Wt 53.1 kg   SpO2 100%   BMI 16.79 kg/m  Physical Exam Vitals and nursing note reviewed.   Constitutional:      General: He is not in acute distress.    Appearance: He is well-developed. He is not ill-appearing, toxic-appearing or diaphoretic.  HENT:     Head: Normocephalic and atraumatic.     Mouth/Throat:     Mouth: Mucous membranes are moist.  Eyes:     Extraocular Movements: Extraocular movements intact.     Conjunctiva/sclera: Conjunctivae normal.  Cardiovascular:     Rate and Rhythm: Normal rate and regular rhythm.     Heart sounds: No murmur heard. Pulmonary:     Effort: Pulmonary effort is normal. No tachypnea or respiratory distress.     Breath sounds: Normal breath sounds. No decreased breath sounds, wheezing, rhonchi or rales.  Chest:     Chest wall: No tenderness.  Abdominal:     Palpations: Abdomen is soft.     Tenderness: There is no abdominal tenderness.  Musculoskeletal:        General: No swelling. Normal range of motion.     Cervical back: Normal range of motion and neck supple.     Right lower leg: No edema.     Left lower leg: No edema.  Skin:    General: Skin is warm and dry.     Capillary Refill: Capillary refill takes less than 2 seconds.     Coloration: Skin is not cyanotic or pale.  Neurological:     General: No focal deficit present.     Mental Status: He is alert and oriented to person, place, and time.  Psychiatric:        Mood and Affect: Mood normal.        Behavior: Behavior normal.     ED Results / Procedures / Treatments   Labs (all labs ordered are listed, but only abnormal results are displayed) Labs Reviewed  BASIC METABOLIC PANEL - Abnormal; Notable for the following components:      Result Value   Chloride 97 (*)    Glucose, Bld 113 (*)    Calcium 8.4 (*)    All other components within normal limits  MAGNESIUM  DIGOXIN LEVEL    EKG None  Radiology DG Chest 2 View  Result Date: 08/09/2022 CLINICAL DATA:  Shortness of breath for weeks. 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 Stable heart size, upper normal. Unchanged mediastinal contours. Chronic hyperinflation and interstitial coarsening. There is no acute airspace disease, pleural effusion, or pneumothorax. Chronic lower thoracic compression deformity. IMPRESSION: 1. No acute abnormality. 2. Chronic hyperinflation and interstitial coarsening consistent with COPD. Electronically Signed   By: Narda Rutherford M.D.   On: 08/09/2022 16:24    Procedures Procedures  Medications Ordered in ED Medications  albuterol (VENTOLIN HFA) 108 (90 Base) MCG/ACT inhaler 2 puff (has no administration in time range)  metoprolol succinate (TOPROL-XL) 24 hr tablet 50 mg (50 mg Oral Given 08/09/22 1742)  furosemide (LASIX) injection 20 mg (20 mg Intravenous Given 08/09/22 1742)    ED Course/ Medical Decision Making/ A&P                             Medical Decision Making Amount and/or Complexity of Data Reviewed Labs: ordered. Radiology: ordered.  Risk Prescription drug management.   This patient presents to the ED for concern of poor sleep, exertional shortness of breath, this involves an extensive number of treatment options, and is a complaint that carries with it a high risk of complications and morbidity.  The differential diagnosis includes CHF, anxiety, polypharmacy, poor nighttime routine   Co morbidities that complicate the patient evaluation  Atrial fibrillation, CAD, hypothyroidism, COPD, insomnia, HTN   Additional history obtained:  Additional history obtained from patient's caregiver External records from outside source obtained and reviewed including EMR   Lab Tests:  I Ordered, and personally interpreted labs.  The pertinent results include: Normal levels of potassium, magnesium, and therapeutic level of digoxin.  Lab work from ED visit earlier this morning at outside hospital was reviewed as well.  Results of that lab work is notable for stable  elevation in troponin and elevation in BNP.   Imaging Studies ordered:  I ordered imaging studies including chest x-ray  I independently visualized and interpreted imaging which showed  no acute findings I agree with the radiologist interpretation   Cardiac Monitoring: / EKG:  The patient was maintained on a cardiac monitor.  I personally viewed and interpreted the cardiac monitored which showed an underlying rhythm of:  atrial fibrillation   Problem List / ED Course / Critical interventions / Medication management  Patient presents for insomnia.  This has been ongoing issue for him over the past several weeks.  He has tried multiple medications with minimal success.  He was seen in the ED earlier today.  While there, he did undergo a workup.  Lab work earlier today is notable for slight increase in creatinine from baseline, hypokalemia with potassium of 2.9, mild elevation in transaminases, UDS positive for THC, slight decrease in hemoglobin from baseline, mild elevation in troponins with slight uptrend on repeats, elevated BNP.  Vital signs here in the ED are normal on arrival.  Once patient was bedded, he did have some slight tachycardia.  Rhythm was A-fib, consistent with known prior diagnosis.  Because he was in the ED earlier today, he did not receive his medications.  Home dose of metoprolol was ordered.  Patient is also on digoxin so we will check that level as well.  On bedside ultrasound, patient does have biapical B-lines.  While this is certainly contributing to his exertional shortness of breath, this is also likely contributing to his anxiety while trying to sleep.  Current diuretic dose is 10 mg of torsemide daily.  Patient was advised to go to 20 mg daily.  Dose of IV Lasix was given here in the ED.  Will also recheck potassium and magnesium levels.patient's lab work shows normal levels of potassium and magnesium.  Digoxin level is therapeutic.  Patient remained in atrial  fibrillation while in the ED.  Heart rate would remain in the range of 100.  He did have good urine  output following dose of IV Lasix.  Patient to increase home torsemide and follow-up with outpatient PCP and cardiologist.  He was discharged in stable condition.. I ordered medication including home dose of metoprolol for atrial fibrillation; Lasix for diuresis Reevaluation of the patient after these medicines showed that the patient improved I have reviewed the patients home medicines and have made adjustments as needed   Social Determinants of Health:  Has access to outpatient care through the [facility]         Final Clinical Impression(s) / ED Diagnoses Final diagnoses:  Poor sleep  Chronic congestive heart failure, unspecified heart failure type [facility])    Rx / DC Orders ED Discharge Orders     None         Gloris Manchester, MD 08/09/22 2038

## 2022-08-09 NOTE — ED Triage Notes (Signed)
Pt via POV with caregiver c/o SOB x 3 weeks and difficulty sleeping plus slight chest pain. He went to [facility] this morning for eval and was told that he needed to be admitted for additional testing, but then he was discharged instead. Pt has hx pacer, "dead thyroid," a fib.

## 2022-08-14 ENCOUNTER — Emergency Department (HOSPITAL_COMMUNITY)
Admission: EM | Admit: 2022-08-14 | Discharge: 2022-08-14 | Disposition: A | Discharge status: Home / Self Care | Payer: No Typology Code available for payment source | Attending: Emergency Medicine | Admitting: Emergency Medicine

## 2022-08-14 ENCOUNTER — Encounter (HOSPITAL_COMMUNITY): Payer: Self-pay | Admitting: Pharmacy Technician

## 2022-08-14 ENCOUNTER — Encounter (HOSPITAL_COMMUNITY): Payer: Self-pay

## 2022-08-14 ENCOUNTER — Other Ambulatory Visit: Payer: Self-pay

## 2022-08-14 ENCOUNTER — Emergency Department (HOSPITAL_COMMUNITY): Payer: No Typology Code available for payment source

## 2022-08-14 DIAGNOSIS — I482 Chronic atrial fibrillation, unspecified: Secondary | ICD-10-CM | POA: Diagnosis not present

## 2022-08-14 DIAGNOSIS — R0601 Orthopnea: Secondary | ICD-10-CM | POA: Diagnosis not present

## 2022-08-14 DIAGNOSIS — G4700 Insomnia, unspecified: Secondary | ICD-10-CM | POA: Insufficient documentation

## 2022-08-14 DIAGNOSIS — Z7901 Long term (current) use of anticoagulants: Secondary | ICD-10-CM | POA: Diagnosis not present

## 2022-08-14 DIAGNOSIS — I2510 Atherosclerotic heart disease of native coronary artery without angina pectoris: Secondary | ICD-10-CM | POA: Insufficient documentation

## 2022-08-14 DIAGNOSIS — E119 Type 2 diabetes mellitus without complications: Secondary | ICD-10-CM | POA: Diagnosis not present

## 2022-08-14 DIAGNOSIS — Z79899 Other long term (current) drug therapy: Secondary | ICD-10-CM | POA: Insufficient documentation

## 2022-08-14 DIAGNOSIS — I509 Heart failure, unspecified: Secondary | ICD-10-CM | POA: Insufficient documentation

## 2022-08-14 DIAGNOSIS — I110 Hypertensive heart disease with heart failure: Secondary | ICD-10-CM | POA: Insufficient documentation

## 2022-08-14 DIAGNOSIS — Z7951 Long term (current) use of inhaled steroids: Secondary | ICD-10-CM | POA: Insufficient documentation

## 2022-08-14 DIAGNOSIS — J449 Chronic obstructive pulmonary disease, unspecified: Secondary | ICD-10-CM | POA: Insufficient documentation

## 2022-08-14 DIAGNOSIS — R0609 Other forms of dyspnea: Secondary | ICD-10-CM

## 2022-08-14 DIAGNOSIS — D649 Anemia, unspecified: Secondary | ICD-10-CM | POA: Insufficient documentation

## 2022-08-14 LAB — BASIC METABOLIC PANEL
Anion gap: 12 (ref 5–15)
BUN: 14 mg/dL (ref 8–23)
CO2: 34 mmol/L — ABNORMAL HIGH (ref 22–32)
Calcium: 8.9 mg/dL (ref 8.9–10.3)
Chloride: 93 mmol/L — ABNORMAL LOW (ref 98–111)
Creatinine, Ser: 1.29 mg/dL — ABNORMAL HIGH (ref 0.61–1.24)
GFR, Estimated: 56 mL/min — ABNORMAL LOW (ref >60)
Glucose, Bld: 112 mg/dL — ABNORMAL HIGH (ref 70–99)
Potassium: 3.5 mmol/L (ref 3.5–5.1)
Sodium: 139 mmol/L (ref 135–145)

## 2022-08-14 LAB — CBC WITH DIFFERENTIAL/PLATELET
Abs Immature Granulocytes: 0.02 10*3/uL (ref 0.00–0.07)
Basophils Absolute: 0.1 10*3/uL (ref 0.0–0.1)
Basophils Relative: 1 %
Eosinophils Absolute: 0.1 10*3/uL (ref 0.0–0.5)
Eosinophils Relative: 2 %
HCT: 31.2 % — ABNORMAL LOW (ref 39.0–52.0)
Hemoglobin: 9.5 g/dL — ABNORMAL LOW (ref 13.0–17.0)
Immature Granulocytes: 0 %
Lymphocytes Relative: 18 %
Lymphs Abs: 1.4 10*3/uL (ref 0.7–4.0)
MCH: 28.4 pg (ref 26.0–34.0)
MCHC: 30.4 g/dL (ref 30.0–36.0)
MCV: 93.4 fL (ref 80.0–100.0)
Monocytes Absolute: 1 10*3/uL (ref 0.1–1.0)
Monocytes Relative: 13 %
Neutro Abs: 5.2 10*3/uL (ref 1.7–7.7)
Neutrophils Relative %: 66 %
Platelets: 325 10*3/uL (ref 150–400)
RBC: 3.34 MIL/uL — ABNORMAL LOW (ref 4.22–5.81)
RDW: 15.7 % — ABNORMAL HIGH (ref 11.5–15.5)
WBC: 7.7 10*3/uL (ref 4.0–10.5)
nRBC: 0 % (ref 0.0–0.2)

## 2022-08-14 LAB — BRAIN NATRIURETIC PEPTIDE: B Natriuretic Peptide: 722.9 pg/mL — ABNORMAL HIGH (ref 0.0–100.0)

## 2022-08-14 LAB — TROPONIN I (HIGH SENSITIVITY): Troponin I (High Sensitivity): 37 ng/L — ABNORMAL HIGH (ref <18)

## 2022-08-14 MED ORDER — RAMELTEON 8 MG PO TABS: 8.0000 mg | ORAL_TABLET | Freq: Every day | ORAL | 0 refills | Status: DC

## 2022-08-14 NOTE — ED Provider Notes (Signed)
[place] EMERGENCY DEPARTMENT AT [facility] Provider Note   CSN: [id number] Arrival date & time: 08/14/22  1047     History  Chief Complaint  Patient presents with   Shortness of Breath   Insomnia    Anthony Charles is a 77 y.o. male with past medical history significant for diabetes, CAD, hypertension, thyroid disease, Afib, CHF, COPD, AICD in place presents to the ED complaining of insomnia for the past few weeks.  He states that he will wake up gasping for air.  Patient has had several ED and PCP visits for same symptoms, was at [facility] ED early this morning.  He has been tried on vistaril, trazodone, melatonin, and Lunesta without improvement.  Patient is currently taking hydroxyzine and melatonin.  He reports the other night, he was able to get approximately 6 hours of sleep with this combination, but it has not worked since.  Patient has not had a formal sleep study performed and has no documented history of sleep apnea.  He reports dyspnea with exertion and orthopnea.  Patient has most difficulty falling asleep and will lie there awake.  Denies hallucinations, racing thoughts, SI, HI, or feelings of depression, chest pain or tightness, leg swelling, palpitations, dizziness, headache, light-headedness, syncope, confusion.         Home Medications Prior to Admission medications   Medication Sig Start Date End Date Taking? Authorizing Provider  acetaminophen (TYLENOL) 325 MG tablet Take 2 tablets (650 mg total) by mouth every 4 (four) hours as needed for mild pain, fever or headache. 06/25/18   Shon Hale, MD  ALPRAZolam Prudy Feeler) 0.25 MG tablet Take 1 tablet (0.25 mg total) by mouth 3 (three) times daily as needed for anxiety or sleep. 06/25/18   Shon Hale, MD  amiodarone (PACERONE) 200 MG tablet Take 1 tablet (200 mg total) by mouth 2 (two) times daily. For 7 Days (last dose 07/02/2018), then 200 mg daily starting 07/03/2018........Marland KitchenFor Heart 06/25/18   Shon Hale, MD  apixaban (ELIQUIS) 5 MG TABS tablet Take 1 tablet (5 mg total) by mouth 2 (two) times daily. 06/25/18   Shon Hale, MD  atorvastatin (LIPITOR) 20 MG tablet Take 1 tablet (20 mg total) by mouth at bedtime. 06/25/18   Shon Hale, MD  busPIRone (BUSPAR) 5 MG tablet Take 1 tablet (5 mg total) by mouth 3 (three) times daily. For Anxiety 06/25/18   Shon Hale, MD  cetirizine (ZYRTEC) 10 MG tablet Take 5 mg by mouth daily.    [provider]  feeding supplement, ENSURE ENLIVE, (ENSURE ENLIVE) LIQD Take 237 mLs by mouth 2 (two) times daily between meals. 06/16/18   Sherryll Burger, Pratik D, DO  furosemide (LASIX) 20 MG tablet Take 1 tablet (20 mg total) by mouth every Tuesday, Thursday, Saturday, and Sunday. For Heart/Fluid 06/26/18 12/03/19  Emokpae, Courage, MD  Ipratropium-Albuterol (COMBIVENT RESPIMAT) 20-100 MCG/ACT AERS respimat Inhale 1 puff into the lungs every 6 (six) hours as needed for wheezing or shortness of breath. Patient not taking: Reported on 06/19/2018 06/16/18   Maurilio Lovely D, DO  levothyroxine (SYNTHROID, LEVOTHROID) 112 MCG tablet Take 112 mcg by mouth every morning.    [provider]  metoprolol succinate (TOPROL XL) 25 MG 24 hr tablet Take 1 tablet (25 mg total) by mouth daily. For Heart 06/25/18 12/03/19  Shon Hale, MD  Multiple Vitamin (MULTIVITAMIN WITH MINERALS) TABS tablet Take 1 tablet by mouth daily.    [provider]  promethazine (PHENERGAN) 25 MG tablet  Take 25 mg by mouth every 6 (six) hours as needed for nausea or vomiting.    [provider]  traZODone (DESYREL) 100 MG tablet Take 2 tablets (200 mg total) by mouth at bedtime. 06/25/18   Shon Hale, MD      Allergies    Patient has no known allergies.    Review of Systems   Review of Systems  Respiratory:  Positive for shortness of breath. Negative for chest tightness.   Cardiovascular:  Negative for chest pain, palpitations and leg swelling.   Neurological:  Negative for dizziness, syncope, light-headedness and headaches.  Psychiatric/Behavioral:  Positive for sleep disturbance. Negative for confusion, hallucinations and suicidal ideas.     Physical Exam Updated Vital Signs BP 102/72   Pulse 90   Temp 98 F (36.7 C)   Resp 17   SpO2 96%  Physical Exam Vitals and nursing note reviewed.  Constitutional:      General: He is not in acute distress.    Appearance: Normal appearance. He is not ill-appearing or diaphoretic.  Eyes:     General: Vision grossly intact.     Extraocular Movements: Extraocular movements intact.     Conjunctiva/sclera: Conjunctivae normal.     Pupils: Pupils are equal, round, and reactive to light.  Cardiovascular:     Rate and Rhythm: Normal rate and regular rhythm.  Pulmonary:     Effort: Pulmonary effort is normal.     Breath sounds: Normal breath sounds and air entry. No rales.  Musculoskeletal:     Right lower leg: No edema.     Left lower leg: No edema.  Skin:    General: Skin is warm and dry.     Capillary Refill: Capillary refill takes less than 2 seconds.     Coloration: Skin is not cyanotic or pale.  Neurological:     Mental Status: He is alert. Mental status is at baseline.  Psychiatric:        Attention and Perception: Attention and perception normal. He does not perceive auditory or visual hallucinations.        Mood and Affect: Mood and affect normal. Mood is not anxious.        Speech: Speech normal.        Behavior: Behavior normal. Behavior is cooperative.        Thought Content: Thought content normal. Thought content is not delusional. Thought content does not include homicidal or suicidal ideation.        Cognition and Memory: Cognition normal.        Judgment: Judgment normal.     ED Results / Procedures / Treatments   Labs (all labs ordered are listed, but only abnormal results are displayed) Labs Reviewed  BASIC METABOLIC PANEL - Abnormal; Notable for the  following components:      Result Value   Chloride 93 (*)    CO2 34 (*)    Glucose, Bld 112 (*)    Creatinine, Ser 1.29 (*)    GFR, Estimated 56 (*)    All other components within normal limits  CBC WITH DIFFERENTIAL/PLATELET - Abnormal; Notable for the following components:   RBC 3.34 (*)    Hemoglobin 9.5 (*)    HCT 31.2 (*)    RDW 15.7 (*)    All other components within normal limits  BRAIN NATRIURETIC PEPTIDE - Abnormal; Notable for the following components:   B Natriuretic Peptide 722.9 (*)    All other components within normal limits  TROPONIN I (HIGH SENSITIVITY) - Abnormal; Notable for the following components:   Troponin I (High Sensitivity) 37 (*)    All other components within normal limits    EKG None  Radiology DG Chest 2 View  Result Date: 08/14/2022 CLINICAL DATA:  Shortness of breath EXAM: CHEST - 2 VIEW COMPARISON:  Aug 09, 2022 FINDINGS: Stable mild cardiomegaly and AICD device. No pneumothorax. No nodules or masses. No focal infiltrates. Flattening of the diaphragm on the lateral view. IMPRESSION: Mild cardiomegaly and flattening of the diaphragm on the lateral view consistent with COPD. No other acute abnormalities. Electronically Signed   By: Gerome Sam III M.D.   On: 08/14/2022 12:02    Procedures Procedures    Medications Ordered in ED Medications - No data to display  ED Course/ Medical Decision Making/ A&P                             Medical Decision Making Amount and/or Complexity of Data Reviewed Radiology: ordered.   This patient presents to the ED with chief complaint(s) of insomnia for several weeks with pertinent past medical history of diabetes, CHF, COPD, HTN, Afib.  The complaint involves an extensive differential diagnosis and also carries with it a high risk of complications and morbidity.    The differential diagnosis includes sleep apnea, nocturnal panic attack, medication adverse effect, acute on chronic CHF, anxiety, poor  sleep hygiene   The initial plan is to obtain baseline labs, troponin, and BNP due to patient's history and complaint  Additional history obtained: Records reviewed  - patient has had multiple ED visits for similar symptoms.  During visit on 08/09/22 at [facility] ED, patient's PRO-BNP was 5,363.  He was evaluated by cardiology but discharged prior to ECHO being performed.  Initial Assessment:   On exam, patient is resting comfortably and is not in acute distress.  Lungs are clear to auscultation bilaterally with adequate tidal volume.  He is speaking in full sentences.  Overall, patient appears euvolemic.  He is moving all extremities appropriately.  He is awake, alert and very animated during conversation.  Denies hallucinations, SI, HI, feelings of anxiety, racing thoughts, or impending doom.    Independent ECG/labs interpretation:  The following labs were independently interpreted:  CBC with anemia, appears to be stable from prior blood work.  Metabolic panel without major electrolyte disturbance, creatinine appears to be above patient's baseline.  Troponin 37, improved from 70 on 08/09/22.  BNP has also improved.   Independent visualization and interpretation of imaging: I independently visualized the following imaging with scope of interpretation limited to determining acute life threatening conditions related to emergency care: chest x-ray, which revealed mild cardiomegaly, no evidence of pneumonia, pleural effusion, or pulmonary edema.  I agree with radiologist interpretation.    Disposition:   Discussed with patient that his workup today is overall reassuring and his BNP and troponin have improved since 08/09/22.  [facility] to determine if patient has sleep apnea or other process disturbing his sleep.  Recommended patient sleep propped up to see if this helps with his orthopnea.  Patient is euvolemic today with stable electrolytes.  Recommended  patient continue on increased dose of diuretic.  Will try patient on ramelteon to help with sleep-onset insomnia.  Will have patient discontinue hydroxyzine and melatonin as this has not helped his symptoms.  Advised patient to also follow-up with  his primary care provider and his cardiologist.    The patient has been appropriately medically screened and/or stabilized in the ED. I have low suspicion for any other emergent medical condition which would require further screening, evaluation or treatment in the ED or require inpatient management. At time of discharge the patient is hemodynamically stable and in no acute distress. I have discussed work-up results and diagnosis with patient and answered all questions. Patient is agreeable with discharge plan. We discussed strict return precautions for returning to the emergency department and they verbalized understanding.           Final Clinical Impression(s) / ED Diagnoses Final diagnoses:  Insomnia, unspecified type  Chronic congestive heart failure, unspecified heart failure type (HCC)  Dyspnea on exertion  Orthopnea    Rx / DC Orders ED Discharge Orders     None         Lenard Simmer, PA-C 08/14/22 1351    Gloris Manchester, MD 08/17/22 0001

## 2022-08-14 NOTE — Discharge Instructions (Addendum)
Thank you for allowing me to be a part of your care today.   Your work-up today is reassuring and your lab work is showing improvement since your last ER visit.    I have sent a new prescription to your pharmacy called ramelteon (Rozerem) to treat your difficulty falling asleep and staying asleep.  Discontinue taking the hydroxyzine and melatonin.  Do not take Xanax or Trazodone while taking this medication as it may increase potential side effects.    Please schedule a follow-up appointment with your primary care provider and your cardiologist.  I highly recommend you ask your primary care doctor about conducting a formal sleep study (also called polysomnography) to assess for sleep apnea or other sleep disturbance which may be causing your symptoms.  I recommend sleeping with a slight incline of your head, instead of completely flat, in the meantime.    Return to the ED if you experience sudden worsening of your symptoms or if you have any new concerns.

## 2022-08-14 NOTE — ED Triage Notes (Signed)
Pt here with reports of being unable to sleep for the last few weeks. States he wakes up gasping for air. Pt talking in complete sentences.

## 2022-08-14 NOTE — ED Provider Notes (Signed)
EMERGENCY DEPARTMENT AT [facility]  Provider Note  CSN: [id number] Arrival date & time: 08/14/22 0128  History Chief Complaint  Patient presents with   Insomnia         Anthony Charles is a 77 y.o. male with history of COPD, CHF and afib here for re-evaluation of insomnia, ongoing for the last several weeks. He has had several ED and PCP visits for same. He was tried on vistaril, trazodone, melatonin and lunesta without improvement, although he reports he slept well last night which is unusual for him. He states tonight he woke up 'gasping for air' which is typically how he wakes up. He is not SOB now. He has had extensive workup at his previous ED visits without acute process identified. He denies any other acute symptoms.    Home Medications Prior to Admission medications   Medication Sig Start Date End Date Taking? Authorizing Provider  acetaminophen (TYLENOL) 325 MG tablet Take 2 tablets (650 mg total) by mouth every 4 (four) hours as needed for mild pain, fever or headache. 06/25/18   Shon Hale, MD  ALPRAZolam Prudy Feeler) 0.25 MG tablet Take 1 tablet (0.25 mg total) by mouth 3 (three) times daily as needed for anxiety or sleep. 06/25/18   Shon Hale, MD  amiodarone (PACERONE) 200 MG tablet Take 1 tablet (200 mg total) by mouth 2 (two) times daily. For 7 Days (last dose 07/02/2018), then 200 mg daily starting 07/03/2018........Marland KitchenFor Heart 06/25/18   Shon Hale, MD  apixaban (ELIQUIS) 5 MG TABS tablet Take 1 tablet (5 mg total) by mouth 2 (two) times daily. 06/25/18   Shon Hale, MD  atorvastatin (LIPITOR) 20 MG tablet Take 1 tablet (20 mg total) by mouth at bedtime. 06/25/18   Shon Hale, MD  busPIRone (BUSPAR) 5 MG tablet Take 1 tablet (5 mg total) by mouth 3 (three) times daily. For Anxiety 06/25/18   Shon Hale, MD  cetirizine (ZYRTEC) 10 MG tablet Take 5 mg by mouth daily.    [provider]  feeding supplement, ENSURE ENLIVE,  (ENSURE ENLIVE) LIQD Take 237 mLs by mouth 2 (two) times daily between meals. 06/16/18   Sherryll Burger, Pratik D, DO  furosemide (LASIX) 20 MG tablet Take 1 tablet (20 mg total) by mouth every Tuesday, Thursday, Saturday, and Sunday. For Heart/Fluid 06/26/18 12/03/19  Emokpae, Courage, MD  Ipratropium-Albuterol (COMBIVENT RESPIMAT) 20-100 MCG/ACT AERS respimat Inhale 1 puff into the lungs every 6 (six) hours as needed for wheezing or shortness of breath. Patient not taking: Reported on 06/19/2018 06/16/18   Maurilio Lovely D, DO  levothyroxine (SYNTHROID, LEVOTHROID) 112 MCG tablet Take 112 mcg by mouth every morning.    [provider]  metoprolol succinate (TOPROL XL) 25 MG 24 hr tablet Take 1 tablet (25 mg total) by mouth daily. For Heart 06/25/18 12/03/19  Shon Hale, MD  Multiple Vitamin (MULTIVITAMIN WITH MINERALS) TABS tablet Take 1 tablet by mouth daily.    [provider]  promethazine (PHENERGAN) 25 MG tablet Take 25 mg by mouth every 6 (six) hours as needed for nausea or vomiting.    [provider]  traZODone (DESYREL) 100 MG tablet Take 2 tablets (200 mg total) by mouth at bedtime. 06/25/18   Shon Hale, MD     Allergies    Patient has no known allergies.   Review of Systems   Review of Systems Please see HPI for pertinent positives and negatives  Physical Exam BP 99/71 (BP Location: Left Arm)  Pulse 80   Temp 98.4 F (36.9 C) (Oral)   Resp 17   Wt 52.6 kg   SpO2 95%   BMI 16.64 kg/m   Physical Exam Vitals and nursing note reviewed.  HENT:     Head: Normocephalic.     Nose: Nose normal.  Eyes:     Extraocular Movements: Extraocular movements intact.  Pulmonary:     Effort: Pulmonary effort is normal.  Musculoskeletal:        General: Normal range of motion.     Cervical back: Neck supple.  Skin:    Findings: No rash (on exposed skin).  Neurological:     Mental Status: He is alert and oriented to person, place, and time.  Psychiatric:         Mood and Affect: Mood normal.     ED Results / Procedures / Treatments   EKG None  Procedures Procedures  Medications Ordered in the ED Medications - No data to display  Initial Impression and Plan  Patient here primarily for his insomnia. It seems his SOB is only intermittent and not currently feeling any dyspnea, CP, no fever, cough or congestion. I discussed his previous workup and trials of medication and that the ED has no other recommendations or workup with regards to his insomnia. I offered to recheck labs and CXR to evaluate his SOB but he declines. Recommend he continue outpatient management of insomnia with his PCP. RTED for any other concerns.   ED Course       MDM Rules/Calculators/A&P Medical Decision Making Problems Addressed: Chronic a-fib [facility]): chronic illness or injury Chronic congestive heart failure, unspecified heart failure type [facility]): chronic illness or injury Insomnia, unspecified type: chronic illness or injury     Final Clinical Impression(s) / ED Diagnoses Final diagnoses:  Insomnia, unspecified type  Chronic a-fib (HCC)  Chronic congestive heart failure, unspecified heart failure type [facility])    Rx / DC Orders ED Discharge Orders     None        Pollyann Savoy, MD 08/14/22 [phone]

## 2022-08-14 NOTE — ED Triage Notes (Signed)
POV/ SHOB  / seen at [facility] x3 days ago for same/ pt not feeling any better/ given IV lasix with some relief/ denies using home O2

## 2022-08-16 ENCOUNTER — Encounter (HOSPITAL_COMMUNITY): Payer: Self-pay | Admitting: Internal Medicine

## 2022-08-16 ENCOUNTER — Observation Stay (HOSPITAL_COMMUNITY): Payer: No Typology Code available for payment source

## 2022-08-16 ENCOUNTER — Other Ambulatory Visit: Payer: Self-pay

## 2022-08-16 ENCOUNTER — Inpatient Hospital Stay (HOSPITAL_COMMUNITY)
Admission: EM | Admit: 2022-08-16 | Discharge: 2022-08-18 | DRG: 682 | Disposition: A | Discharge status: Home / Self Care | Payer: No Typology Code available for payment source | Attending: Internal Medicine | Admitting: Internal Medicine

## 2022-08-16 ENCOUNTER — Emergency Department (HOSPITAL_COMMUNITY): Payer: No Typology Code available for payment source

## 2022-08-16 DIAGNOSIS — R55 Syncope and collapse: Secondary | ICD-10-CM | POA: Diagnosis present

## 2022-08-16 DIAGNOSIS — Z7901 Long term (current) use of anticoagulants: Secondary | ICD-10-CM

## 2022-08-16 DIAGNOSIS — E46 Unspecified protein-calorie malnutrition: Secondary | ICD-10-CM | POA: Diagnosis present

## 2022-08-16 DIAGNOSIS — I959 Hypotension, unspecified: Secondary | ICD-10-CM | POA: Diagnosis present

## 2022-08-16 DIAGNOSIS — Z66 Do not resuscitate: Secondary | ICD-10-CM | POA: Diagnosis present

## 2022-08-16 DIAGNOSIS — Z9102 Food additives allergy status: Secondary | ICD-10-CM

## 2022-08-16 DIAGNOSIS — R42 Dizziness and giddiness: Secondary | ICD-10-CM | POA: Diagnosis not present

## 2022-08-16 DIAGNOSIS — Z574 Occupational exposure to toxic agents in agriculture: Secondary | ICD-10-CM

## 2022-08-16 DIAGNOSIS — Z95810 Presence of automatic (implantable) cardiac defibrillator: Secondary | ICD-10-CM

## 2022-08-16 DIAGNOSIS — I6200 Nontraumatic subdural hemorrhage, unspecified: Secondary | ICD-10-CM | POA: Diagnosis present

## 2022-08-16 DIAGNOSIS — Z681 Body mass index (BMI) 19 or less, adult: Secondary | ICD-10-CM

## 2022-08-16 DIAGNOSIS — F17210 Nicotine dependence, cigarettes, uncomplicated: Secondary | ICD-10-CM | POA: Diagnosis present

## 2022-08-16 DIAGNOSIS — N179 Acute kidney failure, unspecified: Secondary | ICD-10-CM | POA: Diagnosis not present

## 2022-08-16 DIAGNOSIS — I2510 Atherosclerotic heart disease of native coronary artery without angina pectoris: Secondary | ICD-10-CM | POA: Diagnosis present

## 2022-08-16 DIAGNOSIS — G929 Unspecified toxic encephalopathy: Secondary | ICD-10-CM | POA: Diagnosis not present

## 2022-08-16 DIAGNOSIS — Z79899 Other long term (current) drug therapy: Secondary | ICD-10-CM

## 2022-08-16 DIAGNOSIS — E43 Unspecified severe protein-calorie malnutrition: Secondary | ICD-10-CM | POA: Diagnosis present

## 2022-08-16 DIAGNOSIS — E739 Lactose intolerance, unspecified: Secondary | ICD-10-CM | POA: Diagnosis present

## 2022-08-16 DIAGNOSIS — E119 Type 2 diabetes mellitus without complications: Secondary | ICD-10-CM | POA: Diagnosis present

## 2022-08-16 DIAGNOSIS — J449 Chronic obstructive pulmonary disease, unspecified: Secondary | ICD-10-CM | POA: Diagnosis present

## 2022-08-16 DIAGNOSIS — Z888 Allergy status to other drugs, medicaments and biological substances status: Secondary | ICD-10-CM

## 2022-08-16 DIAGNOSIS — E861 Hypovolemia: Secondary | ICD-10-CM | POA: Diagnosis present

## 2022-08-16 DIAGNOSIS — F05 Delirium due to known physiological condition: Secondary | ICD-10-CM | POA: Diagnosis not present

## 2022-08-16 DIAGNOSIS — G4700 Insomnia, unspecified: Secondary | ICD-10-CM | POA: Diagnosis present

## 2022-08-16 DIAGNOSIS — I5022 Chronic systolic (congestive) heart failure: Secondary | ICD-10-CM | POA: Diagnosis present

## 2022-08-16 DIAGNOSIS — T424X5A Adverse effect of benzodiazepines, initial encounter: Secondary | ICD-10-CM | POA: Diagnosis not present

## 2022-08-16 DIAGNOSIS — E860 Dehydration: Secondary | ICD-10-CM | POA: Diagnosis present

## 2022-08-16 DIAGNOSIS — I482 Chronic atrial fibrillation, unspecified: Secondary | ICD-10-CM | POA: Diagnosis present

## 2022-08-16 DIAGNOSIS — E039 Hypothyroidism, unspecified: Secondary | ICD-10-CM | POA: Diagnosis present

## 2022-08-16 DIAGNOSIS — I110 Hypertensive heart disease with heart failure: Secondary | ICD-10-CM | POA: Diagnosis present

## 2022-08-16 DIAGNOSIS — Z8249 Family history of ischemic heart disease and other diseases of the circulatory system: Secondary | ICD-10-CM

## 2022-08-16 DIAGNOSIS — Z79890 Hormone replacement therapy (postmenopausal): Secondary | ICD-10-CM

## 2022-08-16 DIAGNOSIS — F419 Anxiety disorder, unspecified: Secondary | ICD-10-CM | POA: Diagnosis present

## 2022-08-16 LAB — HEPATIC FUNCTION PANEL
ALT: 31 U/L (ref 0–44)
AST: 35 U/L (ref 15–41)
Albumin: 3.4 g/dL — ABNORMAL LOW (ref 3.5–5.0)
Alkaline Phosphatase: 98 U/L (ref 38–126)
Bilirubin, Direct: 0.2 mg/dL (ref 0.0–0.2)
Indirect Bilirubin: 0.5 mg/dL (ref 0.3–0.9)
Total Bilirubin: 0.7 mg/dL (ref 0.3–1.2)
Total Protein: 6.8 g/dL (ref 6.5–8.1)

## 2022-08-16 LAB — CBC WITH DIFFERENTIAL/PLATELET
Abs Immature Granulocytes: 0.01 10*3/uL (ref 0.00–0.07)
Basophils Absolute: 0.1 10*3/uL (ref 0.0–0.1)
Basophils Relative: 1 %
Eosinophils Absolute: 0.4 10*3/uL (ref 0.0–0.5)
Eosinophils Relative: 5 %
HCT: 31.2 % — ABNORMAL LOW (ref 39.0–52.0)
Hemoglobin: 9.5 g/dL — ABNORMAL LOW (ref 13.0–17.0)
Immature Granulocytes: 0 %
Lymphocytes Relative: 26 %
Lymphs Abs: 1.8 10*3/uL (ref 0.7–4.0)
MCH: 28.4 pg (ref 26.0–34.0)
MCHC: 30.4 g/dL (ref 30.0–36.0)
MCV: 93.1 fL (ref 80.0–100.0)
Monocytes Absolute: 0.9 10*3/uL (ref 0.1–1.0)
Monocytes Relative: 13 %
Neutro Abs: 4 10*3/uL (ref 1.7–7.7)
Neutrophils Relative %: 55 %
Platelets: 311 10*3/uL (ref 150–400)
RBC: 3.35 MIL/uL — ABNORMAL LOW (ref 4.22–5.81)
RDW: 15.7 % — ABNORMAL HIGH (ref 11.5–15.5)
WBC: 7.2 10*3/uL (ref 4.0–10.5)
nRBC: 0 % (ref 0.0–0.2)

## 2022-08-16 LAB — BASIC METABOLIC PANEL
Anion gap: 13 (ref 5–15)
BUN: 23 mg/dL (ref 8–23)
CO2: 32 mmol/L (ref 22–32)
Calcium: 8.9 mg/dL (ref 8.9–10.3)
Chloride: 93 mmol/L — ABNORMAL LOW (ref 98–111)
Creatinine, Ser: 1.77 mg/dL — ABNORMAL HIGH (ref 0.61–1.24)
GFR, Estimated: 39 mL/min — ABNORMAL LOW (ref >60)
Glucose, Bld: 104 mg/dL — ABNORMAL HIGH (ref 70–99)
Potassium: 3.4 mmol/L — ABNORMAL LOW (ref 3.5–5.1)
Sodium: 138 mmol/L (ref 135–145)

## 2022-08-16 LAB — DIGOXIN LEVEL: Digoxin Level: 1.2 ng/mL (ref 0.8–2.0)

## 2022-08-16 LAB — TROPONIN I (HIGH SENSITIVITY)
Troponin I (High Sensitivity): 42 ng/L — ABNORMAL HIGH (ref <18)
Troponin I (High Sensitivity): 45 ng/L — ABNORMAL HIGH (ref <18)

## 2022-08-16 LAB — BRAIN NATRIURETIC PEPTIDE: B Natriuretic Peptide: 911.8 pg/mL — ABNORMAL HIGH (ref 0.0–100.0)

## 2022-08-16 LAB — LIPASE, BLOOD: Lipase: 52 U/L — ABNORMAL HIGH (ref 11–51)

## 2022-08-16 MED ORDER — BISACODYL 5 MG PO TBEC: 5.0000 mg | DELAYED_RELEASE_TABLET | Freq: Every day | ORAL | Status: DC | PRN

## 2022-08-16 MED ORDER — SODIUM CHLORIDE 0.9% FLUSH
3.0000 mL | Freq: Two times a day (BID) | INTRAVENOUS | Status: DC
  Administered 2022-08-17 – 2022-08-18 (×2): 3 mL via INTRAVENOUS

## 2022-08-16 MED ORDER — POLYETHYLENE GLYCOL 3350 17 G PO PACK: 17.0000 g | PACK | Freq: Every day | ORAL | Status: DC | PRN

## 2022-08-16 MED ORDER — DOCUSATE SODIUM 100 MG PO CAPS
100.0000 mg | ORAL_CAPSULE | Freq: Two times a day (BID) | ORAL | Status: DC
  Administered 2022-08-16 – 2022-08-18 (×5): 100 mg via ORAL
  Filled 2022-08-16 (×5): qty 1

## 2022-08-16 MED ORDER — APIXABAN 2.5 MG PO TABS
2.5000 mg | ORAL_TABLET | Freq: Two times a day (BID) | ORAL | Status: DC
  Administered 2022-08-16: 2.5 mg via ORAL
  Filled 2022-08-16: qty 1

## 2022-08-16 MED ORDER — ONDANSETRON HCL 4 MG PO TABS: 4.0000 mg | ORAL_TABLET | Freq: Four times a day (QID) | ORAL | Status: DC | PRN

## 2022-08-16 MED ORDER — HYDRALAZINE HCL 20 MG/ML IJ SOLN: 5.0000 mg | INTRAMUSCULAR | Status: DC | PRN

## 2022-08-16 MED ORDER — ATORVASTATIN CALCIUM 10 MG PO TABS
20.0000 mg | ORAL_TABLET | Freq: Every day | ORAL | Status: DC
  Administered 2022-08-16 – 2022-08-17 (×2): 20 mg via ORAL
  Filled 2022-08-16 (×2): qty 2

## 2022-08-16 MED ORDER — LORATADINE 10 MG PO TABS
10.0000 mg | ORAL_TABLET | Freq: Every day | ORAL | Status: DC
  Administered 2022-08-16 – 2022-08-18 (×3): 10 mg via ORAL
  Filled 2022-08-16 (×3): qty 1

## 2022-08-16 MED ORDER — QUETIAPINE FUMARATE 25 MG PO TABS
25.0000 mg | ORAL_TABLET | Freq: Every day | ORAL | Status: DC
  Administered 2022-08-16: 25 mg via ORAL
  Filled 2022-08-16: qty 1

## 2022-08-16 MED ORDER — LACTATED RINGERS IV BOLUS
1000.0000 mL | Freq: Once | INTRAVENOUS | Status: AC
  Administered 2022-08-16: 1000 mL via INTRAVENOUS

## 2022-08-16 MED ORDER — DIGOXIN 125 MCG PO TABS: 0.1250 mg | ORAL_TABLET | Freq: Every day | ORAL | Status: DC

## 2022-08-16 MED ORDER — ONDANSETRON HCL 4 MG/2ML IJ SOLN: 4.0000 mg | Freq: Four times a day (QID) | INTRAMUSCULAR | Status: DC | PRN

## 2022-08-16 MED ORDER — ACETAMINOPHEN 650 MG RE SUPP: 650.0000 mg | Freq: Four times a day (QID) | RECTAL | Status: DC | PRN

## 2022-08-16 MED ORDER — LORAZEPAM 1 MG PO TABS
0.5000 mg | ORAL_TABLET | Freq: Once | ORAL | Status: AC
  Administered 2022-08-16: 0.5 mg via ORAL
  Filled 2022-08-16: qty 1

## 2022-08-16 MED ORDER — LEVOTHYROXINE SODIUM 100 MCG PO TABS
100.0000 ug | ORAL_TABLET | Freq: Every day | ORAL | Status: DC
  Administered 2022-08-16 – 2022-08-18 (×3): 100 ug via ORAL
  Filled 2022-08-16 (×3): qty 1

## 2022-08-16 MED ORDER — ACETAMINOPHEN 325 MG PO TABS: 650.0000 mg | ORAL_TABLET | Freq: Four times a day (QID) | ORAL | Status: DC | PRN

## 2022-08-16 MED ORDER — LACTATED RINGERS IV SOLN: INTRAVENOUS | Status: DC

## 2022-08-16 NOTE — Consult Note (Signed)
Psychiatry Consult 4:49 AM 08/16/2022    He reports coming to the ED due to "I cannot get to sleep." He said he has had this problem persist now for approximately one month. He also describes a "jerking" sensation that "rushes through my body" every few minutes which he attributes to his nerves. He estimates that these problems have been troubling him for about a month, no particular trigger. He has never experienced anything like this in his entire life. His caregiver states that he was recently started on melatonin, hydroxyzine and ramelteon but each have only worked for a single night and then stopped working altogether. He has no seen a sleep specialist. He is a patient at the [place] and has a psychiatrist there. He has no previous inpatient psychiatric hospitalizations. He denies any previous suicide attempts. He denies any SI/HI/AVH currently. He has a safe place to live, with his wife in their home; no access to firearms. He denies any illicit drug use or alcohol abuse. He admits to using MJ perhaps twice per day, on average. There is no mental health history in his family. 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 He says he has never taken any medications which provided lasting benefit for these issues. He says he was prescribed a benzodiazapine recently but it made his feel like he was drunk. He says he was prescribed trazodone for sometime and that "it didn't really do anything."  He says he has not taken quetiapine, benztropine, or mirtazapine in the past.   He is prescribed losartan, furosemide, eloquis, digoxin, atorvastatin, synthroid, cetirizine, Zofran, metoprolol.   ED MD reports he was previously prescribed vistaril, lunesta, melatonin & trazodone during prior visits to this ED for similar complaints in the past week.     Psychiatric Specialty Exam:     Blood pressure (!) 92/55, pulse (!) 43, temperature 98 F (36.7 C), resp. rate (!) 25, SpO2 99 %.There is  no height or weight on file to calculate BMI.  General Appearance: Casual  Eye Contact:  Good  Speech:  Normal Rate  Volume:  Normal  Mood:  Euthymic  Affect:  Congruent  Thought Process:  Coherent  Orientation:  Full (Time, Place, and Person)  Thought Content:  Logical  Suicidal Thoughts:  No  Homicidal Thoughts:  No  Memory:  Immediate;   Good  Judgement:  Fair  Insight:  Fair  Psychomotor Activity:  Normal  Concentration:  Concentration: Fair  Recall:  Fair  Fund of Knowledge:  Fair  Language:  Good  Akathisia:  Yes  Handed:  Right  AIMS (if indicated):     Assets:  Desire for Improvement Housing Leisure Time Resilience Social Support  ADL's:  Intact  Cognition:  WNL  Sleep:   impaired      Recommend seroquel 25mg  po qhs prn insomnia while medical hospitalized for metabolic workup. Recommend EKG for routine monitoring of Qtc and hold antipsychotic if Qtc is greater than . Recommend following up with sleep specialist within one week.   Case discussed with Dr. Manus Gunning at 4:52 AM

## 2022-08-16 NOTE — Evaluation (Signed)
Physical Therapy Evaluation Patient Details Name: Anthony Charles MRN: [id number] DOB: [birth year] Today's Date: 08/16/2022  History of Present Illness  Patient is a 77 y/o male admitted 08/16/22 with c/o insomnia.  Fourth ED visit in one week for complaint.  Head CT revealed new small subdural hematoma in the left convexity without any shift or mass effect.  Had episode of presyncope in the ED with low BP.  PMH positive for Agent Orange exposure, CAD, AICD, HTN, aneurysm, Afib, hernia, DM.  Clinical Impression  Patient presents with mobility at his functional baseline.  He has canes at home and can use for ambulation and has help for all IADL's.  Denies recent falls though noted with SDH.  Currently ambulating close to if not better than his baseline per pt.  Feel no current skilled PT needs though encouraged him to call nursing for mobility during hospitalization.  PT will sign off.        Recommendations for follow up therapy are one component of a multi-disciplinary discharge planning process, led by the attending physician.  Recommendations may be updated based on patient status, additional functional criteria and insurance authorization.  Follow Up Recommendations       Assistance Recommended at Discharge None  Patient can return home with the following  A little help with walking and/or transfers;A little help with bathing/dressing/bathroom;Help with stairs or ramp for entrance;Assist for transportation;Direct supervision/assist for financial management;Direct supervision/assist for medications management    Equipment Recommendations None recommended by PT  Recommendations for Other Services       Functional Status Assessment Patient has had a recent decline in their functional status and demonstrates the ability to make significant improvements in function in a reasonable and predictable amount of time.     Precautions / Restrictions Precautions Precautions: Fall Precaution Comments:  watch BP      Mobility  Bed Mobility               General bed mobility comments: pt siting edge of bed upon entry    Transfers Overall transfer level: Needs assistance Equipment used: None Transfers: Sit to/from Stand Sit to Stand: Min guard, Supervision           General transfer comment: assist due to lines    Ambulation/Gait Ambulation/Gait assistance: Supervision, Min guard Gait Distance (Feet): 120 Feet Assistive device: None Gait Pattern/deviations: Step-through pattern, Decreased stride length, Trunk flexed, Shuffle       General Gait Details: patient reports walking further then he usually can as he gets fatigued quickly  Stairs            Wheelchair Mobility    Modified Rankin (Stroke Patients Only)       Balance Overall balance assessment: Needs assistance   Sitting balance-Leahy Scale: Good       Standing balance-Leahy Scale: Good Standing balance comment: some evidence for imbalance with ambulation, uses cane at times at home and has limited ambulation distance                             Pertinent Vitals/Pain Pain Assessment Pain Assessment: Faces Faces Pain Scale: Hurts a little bit Pain Location: generalized, back Pain Descriptors / Indicators: Discomfort Pain Intervention(s): Monitored during session    Home Living Family/patient expects to be discharged to:: Private residence Living Arrangements: Spouse/significant other Available Help at Discharge: Family Type of Home: House Home Access: Stairs to enter   [employer] of Steps:  4 (short steps)   Home Layout: One level Home Equipment: Wheelchair - manual;Cane - single point Additional Comments: spouse is 23 years younger    Prior Function Prior Level of Function : Needs assist               ADLs Comments: has help for all IADL's from a friend who is available by phone call, wife drives     Hand Dominance   Dominant Hand: Right     Extremity/Trunk Assessment   Upper Extremity Assessment Upper Extremity Assessment: Defer to OT evaluation    Lower Extremity Assessment Lower Extremity Assessment: Generalized weakness    Cervical / Trunk Assessment Cervical / Trunk Assessment: Kyphotic  Communication   Communication: No difficulties  Cognition Arousal/Alertness: Awake/alert Behavior During Therapy: WFL for tasks assessed/performed Overall Cognitive Status: Within Functional Limits for tasks assessed                                          General Comments General comments (skin integrity, edema, etc.): VSS though BP soft in 90's/50-60's throughout RN aware and pt asymptomatic with rising    Exercises     Assessment/Plan    PT Assessment Patient does not need any further PT services  PT Problem List         PT Treatment Interventions      PT Goals (Current goals can be found in the Care Plan section)  Acute Rehab PT Goals Patient Stated Goal: to return home and get some sleep PT Goal Formulation: With patient Time For Goal Achievement: 08/23/22 Potential to Achieve Goals: Good    Frequency       Co-evaluation               AM-PAC PT "6 Clicks" Mobility  Outcome Measure Help needed turning from your back to your side while in a flat bed without using bedrails?: None Help needed moving from lying on your back to sitting on the side of a flat bed without using bedrails?: None Help needed moving to and from a bed to a chair (including a wheelchair)?: A Little Help needed standing up from a chair using your arms (e.g., wheelchair or bedside chair)?: A Little Help needed to walk in hospital room?: A Little Help needed climbing 3-5 steps with a railing? : Total 6 Click Score: 18    End of Session Equipment Utilized During Treatment: Gait belt Activity Tolerance: Patient tolerated treatment well Patient left: in bed;with call bell/phone within reach   PT Visit  Diagnosis: Muscle weakness (generalized) (M62.81)    Time: 1027-2536 PT Time Calculation (min) (ACUTE ONLY): 17 min   Charges:   PT Evaluation $PT Eval Low Complexity: 1 Low          Sheran Lawless, PT Acute Rehabilitation Services Office:[phone] 08/16/2022   Anthony Charles 08/16/2022, 2:49 PM

## 2022-08-16 NOTE — Evaluation (Signed)
Occupational Therapy Evaluation and Discharge Patient Details Name: Anthony Charles MRN: [id number] DOB: 10-Dec-1945 Today's Date: 08/16/2022   History of Present Illness 77 y/o male admitted 08/16/22 with c/o insomnia.  Fourth ED visit in one week for complaint.  Head CT revealed new small subdural hematoma in the left convexity without any shift or mass effect.  Had episode of presyncope in the ED with low BP.  PMH positive for Agent Orange exposure, CAD, AICD, HTN, aneurysm, Afib, hernia, DM.   Clinical Impression   PTA, pt was living with his wife and was performing ADLs. Pt has assistance for all IADLs from wife and close friend. Pt presenting with fatigue and reports he hasn't slept well. Pt presenting near baseline functional for ADLs and mobility. BP soft but asymptomatic. Answered all pt questions. Recommend dc home once medically stable per physician. All acute OT needs met and will sign off. Thank you.      Recommendations for follow up therapy are one component of a multi-disciplinary discharge planning process, led by the attending physician.  Recommendations may be updated based on patient status, additional functional criteria and insurance authorization.   Assistance Recommended at Discharge Intermittent Supervision/Assistance  Patient can return home with the following Assistance with cooking/housework;Assist for transportation    Functional Status Assessment  Patient has not had a recent decline in their functional status  Equipment Recommendations  None recommended by OT    Recommendations for Other Services       Precautions / Restrictions Precautions Precautions: Fall Precaution Comments: watch BP      Mobility Bed Mobility               General bed mobility comments: pt siting edge of bed upon entry    Transfers Overall transfer level: Needs assistance Equipment used: None Transfers: Sit to/from Stand Sit to Stand: Min guard, Supervision            General transfer comment: assist due to lines      Balance Overall balance assessment: Needs assistance   Sitting balance-Leahy Scale: Good       Standing balance-Leahy Scale: Good Standing balance comment: some evidence for imbalance with ambulation, uses cane at times at home and has limited ambulation distance                           ADL either performed or assessed with clinical judgement   ADL Overall ADL's : Modified independent                                       General ADL Comments: Increased time and effort. At baseline function     Vision         Perception     Praxis      Pertinent Vitals/Pain Pain Assessment Pain Assessment: Faces Faces Pain Scale: Hurts a little bit Pain Location: generalized, back Pain Descriptors / Indicators: Discomfort Pain Intervention(s): Monitored during session, Repositioned     Hand Dominance Right   Extremity/Trunk Assessment Upper Extremity Assessment Upper Extremity Assessment: Overall WFL for tasks assessed   Lower Extremity Assessment Lower Extremity Assessment: Defer to PT evaluation   Cervical / Trunk Assessment Cervical / Trunk Assessment: Kyphotic   Communication Communication Communication: No difficulties   Cognition Arousal/Alertness: Awake/alert Behavior During Therapy: WFL for tasks assessed/performed Overall Cognitive Status: Within Functional Limits for  tasks assessed                                       General Comments  BP sitting 90/65, standing 89/58, and after walking 97/71. Asymptomatic    Exercises     Shoulder Instructions      Home Living Family/patient expects to be discharged to:: Private residence Living Arrangements: Spouse/significant other Available Help at Discharge: Family Type of Home: House Home Access: Stairs to enter [employer] of Steps: 4 (short steps)   Home Layout: One level     Bathroom Shower/Tub:  Tub/shower unit;Walk-in Human resources officer: Standard     Home Equipment: Wheelchair - manual;Cane - single point;Shower seat - built in   Additional Comments: spouse is 23 years younger      Prior Functioning/Environment Prior Level of Function : Needs assist               ADLs Comments: has help for all IADL's from a friend who is available by phone call, wife drives        OT Problem List: Decreased activity tolerance;Decreased knowledge of precautions;Decreased strength      OT Treatment/Interventions:      OT Goals(Current goals can be found in the care plan section) Acute Rehab OT Goals Patient Stated Goal: Be able to sleep OT Goal Formulation: All assessment and education complete, DC therapy  OT Frequency:      Co-evaluation              AM-PAC OT "6 Clicks" Daily Activity     Outcome Measure Help from another person eating meals?: None Help from another person taking care of personal grooming?: None Help from another person toileting, which includes using toliet, bedpan, or urinal?: None Help from another person bathing (including washing, rinsing, drying)?: None Help from another person to put on and taking off regular upper body clothing?: None Help from another person to put on and taking off regular lower body clothing?: None 6 Click Score: 24   End of Session Equipment Utilized During Treatment: Gait belt Nurse Communication: Mobility status  Activity Tolerance: Patient tolerated treatment well Patient left: in bed;with call bell/phone within reach  OT Visit Diagnosis: Other abnormalities of gait and mobility (R26.89);Muscle weakness (generalized) (M62.81)                Time: 0981-1914 OT Time Calculation (min): 14 min Charges:  OT General Charges $OT Visit: 1 Visit OT Evaluation $OT Eval Low Complexity: 1 Low  [name] MSOT, OTR/L Acute Rehab Office: [phone]  [name] 08/16/2022, 3:25 PM

## 2022-08-16 NOTE — Consult Note (Signed)
Reason for Consult: Subdural hematoma Referring Physician: Dr. Jonah Blue  Anthony Charles is an 77 y.o. right handed male.  HPI: Patient is a 77 year old individual whose had insomnia for a month's time which is a new process for him.  During his workup a CT of the head was performed which demonstrates the presence of a new small subdural hematoma in the left convexity without any shift or mass effect.  Patient has evidence of significant subarachnoid spaces on his CT scan from a few weeks ago and this is not changed but the amount of blood that is present under the left convexity is tiny and does not add to any shift or mass effect.  Past Medical History:  Diagnosis Date   Agent orange exposure    AICD (automatic cardioverter/defibrillator) present    Aneurysm (HCC)    Atrial fibrillation (HCC)    CAD (coronary artery disease)    Diabetes mellitus without complication (HCC)    Hernia of abdominal wall    Hypertension    Thyroid disease     Past Surgical History:  Procedure Laterality Date   pacemaker/defibrillator      Family History  Problem Relation Age of Onset   CAD Father     Social History:  reports that he has been smoking cigarettes and cigars. He has a 30.00 pack-year smoking history. He has never used smokeless tobacco. He reports current drug use. Drug: Marijuana. He reports that he does not drink alcohol.  Allergies:  Allergies  Allergen Reactions   Grapefruit Extract    Lactose Other (See Comments)    GI intolerance    Spironolactone Other (See Comments)    Gynecomastia       Medications: I have reviewed the patient's current medications.  Results for orders placed or performed during the hospital encounter of 08/16/22 (from the past 48 hour(s))  CBC with Differential     Status: Abnormal   Collection Time: 08/16/22  3:10 AM  Result Value Ref Range   WBC 7.2 4.0 - 10.5 K/uL   RBC 3.35 (L) 4.22 - 5.81 MIL/uL   Hemoglobin 9.5 (L) 13.0 - 17.0 g/dL    HCT 98.1 (L) 19.1 - 52.0 %   MCV 93.1 80.0 - 100.0 fL   MCH 28.4 26.0 - 34.0 pg   MCHC 30.4 30.0 - 36.0 g/dL   RDW 47.8 (H) 29.5 - 62.1 %   Platelets 311 150 - 400 K/uL   nRBC 0.0 0.0 - 0.2 %   Neutrophils Relative % 55 %   Neutro Abs 4.0 1.7 - 7.7 K/uL   Lymphocytes Relative 26 %   Lymphs Abs 1.8 0.7 - 4.0 K/uL   Monocytes Relative 13 %   Monocytes Absolute 0.9 0.1 - 1.0 K/uL   Eosinophils Relative 5 %   Eosinophils Absolute 0.4 0.0 - 0.5 K/uL   Basophils Relative 1 %   Basophils Absolute 0.1 0.0 - 0.1 K/uL   Immature Granulocytes 0 %   Abs Immature Granulocytes 0.01 0.00 - 0.07 K/uL    Comment: Performed at [facility], [address], [place], [place] [address]  Basic metabolic panel     Status: Abnormal   Collection Time: 08/16/22  3:10 AM  Result Value Ref Range   Sodium 138 135 - 145 mmol/L   Potassium 3.4 (L) 3.5 - 5.1 mmol/L   Chloride 93 (L) 98 - 111 mmol/L   CO2 32 22 - 32 mmol/L   Glucose, Bld 104 (H)  70 - 99 mg/dL    Comment: Glucose reference range applies only to samples taken after fasting for at least 8 hours.   BUN 23 8 - 23 mg/dL   Creatinine, Ser 1.61 (H) 0.61 - 1.24 mg/dL   Calcium 8.9 8.9 - 09.6 mg/dL   GFR, Estimated 39 (L) >60 mL/min    Comment: (NOTE) Calculated using the CKD-EPI Creatinine Equation (2021)    Anion gap 13 5 - 15    Comment: Performed at [facility], [address], [place], [place] [address]  Troponin I (High Sensitivity)     Status: Abnormal   Collection Time: 08/16/22  3:10 AM  Result Value Ref Range   Troponin I (High Sensitivity) 42 (H) <18 ng/L    Comment: (NOTE) Elevated high sensitivity troponin I (hsTnI) values and significant  changes across serial measurements may suggest ACS but many other  chronic and acute conditions are known to elevate hsTnI results.  Refer to the "Links" section for chest pain algorithms and additional  guidance. Performed at [facility], [address], [place],  [place] [address]   Brain natriuretic peptide     Status: Abnormal   Collection Time: 08/16/22  3:10 AM  Result Value Ref Range   B Natriuretic Peptide 911.8 (H) 0.0 - 100.0 pg/mL    Comment: Performed at [facility], [address], [place], [place] [address]  Digoxin level     Status: None   Collection Time: 08/16/22  3:10 AM  Result Value Ref Range   Digoxin Level 1.2 0.8 - 2.0 ng/mL    Comment: Performed at [facility], [address] [place], [place] [address]  Troponin I (High Sensitivity)     Status: Abnormal   Collection Time: 08/16/22  5:01 AM  Result Value Ref Range   Troponin I (High Sensitivity) 45 (H) <18 ng/L    Comment: (NOTE) Elevated high sensitivity troponin I (hsTnI) values and significant  changes across serial measurements may suggest ACS but many other  chronic and acute conditions are known to elevate hsTnI results.  Refer to the "Links" section for chest pain algorithms and additional  guidance. Performed at [facility], [address], [place], [place] [address]   Hepatic function panel     Status: Abnormal   Collection Time: 08/16/22  5:01 AM  Result Value Ref Range   Total Protein 6.8 6.5 - 8.1 g/dL   Albumin 3.4 (L) 3.5 - 5.0 g/dL   AST 35 15 - 41 U/L   ALT 31 0 - 44 U/L   Alkaline Phosphatase 98 38 - 126 U/L   Total Bilirubin 0.7 0.3 - 1.2 mg/dL   Bilirubin, Direct 0.2 0.0 - 0.2 mg/dL   Indirect Bilirubin 0.5 0.3 - 0.9 mg/dL    Comment: Performed at [facility], [address] [place], [place] [address]  Lipase, blood     Status: Abnormal   Collection Time: 08/16/22  5:01 AM  Result Value Ref Range   Lipase 52 (H) 11 - 51 U/L    Comment: Performed at [facility], [address] [place], [place] [address]    CT HEAD WO CONTRAST ( )  Result Date: 08/16/2022 CLINICAL DATA:  Altered mental status EXAM: CT HEAD WITHOUT CONTRAST TECHNIQUE: Contiguous axial images were obtained from the base of the skull through the  vertex without intravenous contrast. RADIATION DOSE REDUCTION: This exam was performed according to  the departmental dose-optimization program which includes automated exposure control, adjustment of the mA and/or kV according to patient size and/or use of iterative reconstruction technique. COMPARISON:  CT brain 08/09/22 FINDINGS: Brain: Compared to prior exam there may be a new small extra-axial fluid collection along the left frontal convexity, likely a subdural hematoma (series 4, image 22) measuring 5 mm. While the prominence of subarachnoid space was present on prior imaging, the slightly hyperdense appearance is new from prior exam. No hydrocephalus. No CT evidence of an acute cortical infarct. Sequela of mild chronic microvascular ischemic change with generalized volume loss. No mass effect. Vascular: No hyperdense vessel or unexpected calcification. Skull: Normal. Negative for fracture or focal lesion. Sinuses/Orbits: No middle ear or mastoid effusion. Paranasal sinuses are clear. Orbits are unremarkable. Other: None. IMPRESSION: Compared to prior exam there may be a new small extra-axial fluid collection along the left frontal convexity, likely a subdural hematoma. While the prominence of subarachnoid space was present on prior imaging, the slightly hyperdense appearance is new from prior exam. Recommend short interval follow-up head CT. Electronically Signed   By: Lorenza Cambridge M.D.   On: 08/16/2022 09:45   DG Chest 2 View  Result Date: 08/16/2022 CLINICAL DATA:  near syncope EXAM: CHEST - 2 VIEW COMPARISON:  Chest x-ray 08/13/2012 FINDINGS: Left chest wall dual lead pacemaker and defibrillator in similar position. The heart and mediastinal contours are unchanged. Aortic calcification. Hyperinflation of the lungs. Biapical pleural/pulmonary scarring. No focal consolidation. No pulmonary edema. No pleural effusion. No pneumothorax. No acute osseous abnormality. IMPRESSION: 1. No active cardiopulmonary  disease. 2. Aortic Atherosclerosis (ICD10-I70.0) and Emphysema (ICD10-J43.9). Electronically Signed   By: Tish Frederickson M.D.   On: 08/16/2022 03:31    Review of Systems  Constitutional:        Insomnia  Eyes: Negative.   Respiratory: Negative.  Negative for wheezing.   Cardiovascular: Negative.   Gastrointestinal: Negative.   Endocrine: Positive for cold intolerance.  Genitourinary: Negative.   Musculoskeletal: Negative.   Allergic/Immunologic: Negative.   Neurological: Negative.   Hematological: Negative.   Psychiatric/Behavioral: Negative.     Blood pressure 103/81, pulse 85, temperature 98.8 F (37.1 C), temperature source Oral, resp. rate 18, SpO2 95 %. Physical Exam Constitutional:      Appearance: Normal appearance. He is normal weight.  HENT:     Head: Normocephalic and atraumatic.     Right Ear: Tympanic membrane, ear canal and external ear normal.     Left Ear: Tympanic membrane normal.     Nose: Nose normal.     Mouth/Throat:     Mouth: Mucous membranes are moist.     Pharynx: Oropharynx is clear.  Eyes:     Extraocular Movements: Extraocular movements intact.     Conjunctiva/sclera: Conjunctivae normal.     Pupils: Pupils are equal, round, and reactive to light.  Cardiovascular:     Rate and Rhythm: Rhythm irregular.  Musculoskeletal:     Cervical back: Normal range of motion and neck supple.  Neurological:     General: No focal deficit present.     Mental Status: He is alert.  Psychiatric:        Mood and Affect: Mood normal.        Behavior: Behavior normal.        Thought Content: Thought content normal.        Judgment: Judgment normal.     Assessment/Plan: New diagnosis small left convexity subdural hematoma.  Plan at this point  I would advise only observation of this process I do not believe it is causally related to his insomnia.  Repeat CT has been suggested to rule out any progression of the subdural and given the fact that he is on Eliquis  this is not unreasonable. Reconsult as necessary  Stefani Dama 08/16/2022, 11:48 AM

## 2022-08-16 NOTE — ED Notes (Signed)
Pts Pacemaker interrogated; [employer].

## 2022-08-16 NOTE — ED Provider Notes (Signed)
[place] EMERGENCY DEPARTMENT AT [facility] Provider Note   CSN: [id number] Arrival date & time: 08/16/22  0140     History  Chief Complaint  Patient presents with   Insomnia    Hassel Tetterton is a 77 y.o. male.   77 y.o. male with past medical history significant for diabetes, CAD, hypertension, thyroid disease, Afib, CHF, COPD, AICD returns to the ED with ongoing insomnia.  Multiple evaluations for same.  He was seen at [facility] 2 days ago and prescribed ramelteon.  He took 2 doses of this and states "the first dose worked" but the second 1 did not".  Still having insomnia presents again for reevaluation.  Has had multiple evaluations for the same and multiple medications including hydroxyzine, melatonin, Lunesta.  The only thing he is taking now is the ramelteon. He did not any pain.  He denies any chest pain or shortness of breath.  No dizziness or lightheadedness.  No nausea or vomiting.  No chest pain or shortness of breath.  Did have a near syncopal episode in the bathroom after arrival to the ED but did not fall or hit his head.  Denies feeling dizzy or lightheaded currently.  No room spinning dizziness.  Caregiver concerned about adverse reaction to the medication he was prescribed.  Reports "jitters" that have worsened over the past several weeks but this preceded initiation of the new medication.  The history is provided by the patient and a caregiver.  Insomnia Pertinent negatives include no abdominal pain, no headaches and no shortness of breath.       Home Medications Prior to Admission medications   Medication Sig Start Date End Date Taking? Authorizing Provider  acetaminophen (TYLENOL) 325 MG tablet Take 2 tablets (650 mg total) by mouth every 4 (four) hours as needed for mild pain, fever or headache. 06/25/18   Shon Hale, MD  ALPRAZolam Prudy Feeler) 0.25 MG tablet Take 1 tablet (0.25 mg total) by mouth 3 (three) times daily as needed for  anxiety or sleep. 06/25/18   Shon Hale, MD  amiodarone (PACERONE) 200 MG tablet Take 1 tablet (200 mg total) by mouth 2 (two) times daily. For 7 Days (last dose 07/02/2018), then 200 mg daily starting 07/03/2018........Marland KitchenFor Heart 06/25/18   Shon Hale, MD  apixaban (ELIQUIS) 5 MG TABS tablet Take 1 tablet (5 mg total) by mouth 2 (two) times daily. 06/25/18   Shon Hale, MD  atorvastatin (LIPITOR) 20 MG tablet Take 1 tablet (20 mg total) by mouth at bedtime. 06/25/18   Shon Hale, MD  busPIRone (BUSPAR) 5 MG tablet Take 1 tablet (5 mg total) by mouth 3 (three) times daily. For Anxiety 06/25/18   Shon Hale, MD  cetirizine (ZYRTEC) 10 MG tablet Take 5 mg by mouth daily.    [provider]  feeding supplement, ENSURE ENLIVE, (ENSURE ENLIVE) LIQD Take 237 mLs by mouth 2 (two) times daily between meals. 06/16/18   Sherryll Burger, Pratik D, DO  furosemide (LASIX) 20 MG tablet Take 1 tablet (20 mg total) by mouth every Tuesday, Thursday, Saturday, and Sunday. For Heart/Fluid 06/26/18 12/03/19  Emokpae, Courage, MD  Ipratropium-Albuterol (COMBIVENT RESPIMAT) 20-100 MCG/ACT AERS respimat Inhale 1 puff into the lungs every 6 (six) hours as needed for wheezing or shortness of breath. Patient not taking: Reported on 06/19/2018 06/16/18   Maurilio Lovely D, DO  levothyroxine (SYNTHROID, LEVOTHROID) 112 MCG tablet Take 112 mcg by mouth every morning.    [provider]  metoprolol succinate (  TOPROL XL) 25 MG 24 hr tablet Take 1 tablet (25 mg total) by mouth daily. For Heart 06/25/18 12/03/19  Shon Hale, MD  Multiple Vitamin (MULTIVITAMIN WITH MINERALS) TABS tablet Take 1 tablet by mouth daily.    [provider]  promethazine (PHENERGAN) 25 MG tablet Take 25 mg by mouth every 6 (six) hours as needed for nausea or vomiting.    [provider]  ramelteon (ROZEREM) 8 MG tablet Take 1 tablet (8 mg total) by mouth at bedtime. 08/14/22   Clark, Meghan R, PA-C  traZODone  (DESYREL) 100 MG tablet Take 2 tablets (200 mg total) by mouth at bedtime. 06/25/18   Shon Hale, MD      Allergies    Patient has no known allergies.    Review of Systems   Review of Systems  Constitutional:  Positive for fatigue. Negative for activity change, appetite change and fever.  HENT:  Negative for congestion and rhinorrhea.   Respiratory:  Negative for cough, chest tightness and shortness of breath.   Gastrointestinal:  Negative for abdominal pain, nausea and vomiting.  Genitourinary:  Negative for dysuria.  Musculoskeletal:  Negative for arthralgias and myalgias.  Skin:  Negative for wound.  Neurological:  Negative for dizziness, numbness and headaches.  Psychiatric/Behavioral:  Positive for sleep disturbance. Negative for confusion and suicidal ideas. The patient has insomnia.    all other systems are negative except as noted in the HPI and PMH.    Physical Exam Updated Vital Signs BP 118/89   Pulse 71   Temp 98 F (36.7 C)   Resp 18   SpO2 96%  Physical Exam Vitals and nursing note reviewed.  Constitutional:      General: He is not in acute distress.    Appearance: He is well-developed.  HENT:     Head: Normocephalic and atraumatic.     Mouth/Throat:     Pharynx: No oropharyngeal exudate.  Eyes:     Conjunctiva/sclera: Conjunctivae normal.     Pupils: Pupils are equal, round, and reactive to light.  Neck:     Comments: No meningismus. Cardiovascular:     Rate and Rhythm: Normal rate and regular rhythm.     Heart sounds: Normal heart sounds. No murmur heard.    Comments: AICD in place Pulmonary:     Effort: Pulmonary effort is normal. No respiratory distress.     Breath sounds: Normal breath sounds.  Abdominal:     Palpations: Abdomen is soft.     Tenderness: There is no abdominal tenderness. There is no guarding or rebound.  Musculoskeletal:        General: No tenderness. Normal range of motion.     Cervical back: Normal range of motion and  neck supple.  Skin:    General: Skin is warm.  Neurological:     Mental Status: He is alert and oriented to person, place, and time.     Cranial Nerves: No cranial nerve deficit.     Motor: No abnormal muscle tone.     Coordination: Coordination normal.     Comments: No ataxia on finger to nose bilaterally. No pronator drift. 5/5 strength throughout. CN 2-12 intact.Equal grip strength. Sensation intact.   Psychiatric:        Behavior: Behavior normal.     ED Results / Procedures / Treatments   Labs (all labs ordered are listed, but only abnormal results are displayed) Labs Reviewed  CBC WITH DIFFERENTIAL/PLATELET - Abnormal; Notable for the following components:  Result Value   RBC 3.35 (*)    Hemoglobin 9.5 (*)    HCT 31.2 (*)    RDW 15.7 (*)    All other components within normal limits  BASIC METABOLIC PANEL - Abnormal; Notable for the following components:   Potassium 3.4 (*)    Chloride 93 (*)    Glucose, Bld 104 (*)    Creatinine, Ser 1.77 (*)    GFR, Estimated 39 (*)    All other components within normal limits  BRAIN NATRIURETIC PEPTIDE - Abnormal; Notable for the following components:   B Natriuretic Peptide 911.8 (*)    All other components within normal limits  HEPATIC FUNCTION PANEL - Abnormal; Notable for the following components:   Albumin 3.4 (*)    All other components within normal limits  LIPASE, BLOOD - Abnormal; Notable for the following components:   Lipase 52 (*)    All other components within normal limits  TROPONIN I (HIGH SENSITIVITY) - Abnormal; Notable for the following components:   Troponin I (High Sensitivity) 42 (*)    All other components within normal limits  TROPONIN I (HIGH SENSITIVITY) - Abnormal; Notable for the following components:   Troponin I (High Sensitivity) 45 (*)    All other components within normal limits  DIGOXIN LEVEL    EKG None  Radiology DG Chest 2 View  Result Date: 08/14/2022 CLINICAL DATA:  Shortness of  breath EXAM: CHEST - 2 VIEW COMPARISON:  Aug 09, 2022 FINDINGS: Stable mild cardiomegaly and AICD device. No pneumothorax. No nodules or masses. No focal infiltrates. Flattening of the diaphragm on the lateral view. IMPRESSION: Mild cardiomegaly and flattening of the diaphragm on the lateral view consistent with COPD. No other acute abnormalities. Electronically Signed   By: Gerome Sam III M.D.   On: 08/14/2022 12:02    Procedures Procedures    Medications Ordered in ED Medications - No data to display  ED Course/ Medical Decision Making/ A&P                             Medical Decision Making Amount and/or Complexity of Data Reviewed Independent Historian: caregiver Labs: ordered. Decision-making details documented in ED Course. Radiology: ordered and independent interpretation performed. Decision-making details documented in ED Course. ECG/medicine tests: ordered and independent interpretation performed. Decision-making details documented in ED Course.  Risk Prescription drug management. Decision regarding hospitalization.   Recurrent insomnia with multiple evaluations for same.  Vital stable, no distress.  Patient calm and cooperative.  Initial blood pressure 71/62 appears to be spurious.  Was 118/89 on recheck.  Patient mentating normally with normal neurological exam.  Patient given IV fluids.  Creatinine today slightly worse 1.7 from 1.1.  Did have near syncopal episode in the bathroom with lightheadedness and feel like he is going to pass out.  Did not fall or hit his head.  No chest pain or shortness of breath.  EKG is rate controlled atrial fibrillation.  Psychiatry consult obtained for medication recommendations given recurrent visits for insomnia.  Discussed with Dr. Katrinka Blazing.  He recommends low-dose quetiapine 25 mg in place of ramelteon. Patient not amenable to trying benzodiazepines.  Blood pressure remains low in the 80s.  Additional IV fluids to be given.  Labs  show AKI as above.  Troponin elevation but flat.  Not consistent with ACS.  Given his near syncope and ongoing hypotension will plan admission observation.  Remains hypotensive in the 80s.  Additional IV fluids are provided.  Suspect likely hypovolemia leading to his near syncope and AKI.  Plan admission for IV hydration and monitoring for heart arrhythmias.  Interrogation of pacemaker pending. Admission discussed with Dr. Ophelia Charter       Final Clinical Impression(s) / ED Diagnoses Final diagnoses:  None    Rx / DC Orders ED Discharge Orders     None         Arham Symmonds, Jeannett Senior, MD 08/16/22 [phone]

## 2022-08-16 NOTE — BH Assessment (Signed)
This consult has been deferred to [facility]. According to IRIS coordinator, Maralyn Sago, pt will be seen at 4:00 am by Truman Hayward, DO. Tiffany, RN was notified via secure messaging.

## 2022-08-16 NOTE — H&P (Signed)
History and Physical    Patient: Anthony Charles WGN:[id number] DOB: 17-Jul-1945 DOA: 08/16/2022 DOS: the patient was seen and examined on 08/16/2022 PCP: [facility]  Patient coming from: Home - lives with wife, has a caregiver; NOK: Wife, Anthony Charles, [phone]   Chief Complaint: Insomnia  HPI: Anthony Charles is a 77 y.o. male with medical history significant of agent orange exposure, chronic systolic CHF with AICD, afib, CAD, DM, HTN, and hypothyroidism presenting with insomnia. For the last month, he has been having insomnia, SOB.  He has been prescribed various sleep medications and they haven't worked.  He was recently given Ramelteon and it worked only one night.  He has been eating and drinking well, not sure why he has AKI.  He is not really feeling light-headed or dizzy.  He currently feels fatigued, anxious.  "I feel like a dog."  In the ER, his BP dropped and he became near-syncopal in the bathroom.  TTS was consulted and recommended addition of Seroquel qhs.      ER Course:  4th ED visit of the week for insomnia.  Not improving with an array of medications.  Pre-syncopal episode here, remained hypotensive.  BP in 70s. Given 2L IVF.  Still a little low.  Creatinine 1.7, usually <1.  Seen by psych, ordered Seroquel, Ativan.  Interrogating pacer.       Review of Systems: As mentioned in the history of present illness. All other systems reviewed and are negative. Past Medical History:  Diagnosis Date   Agent orange exposure    AICD (automatic cardioverter/defibrillator) present    Aneurysm (HCC)    Atrial fibrillation (HCC)    CAD (coronary artery disease)    Diabetes mellitus without complication (HCC)    Hernia of abdominal wall    Hypertension    Thyroid disease    Past Surgical History:  Procedure Laterality Date   pacemaker/defibrillator     Social History:  reports that he has been smoking cigarettes and cigars. He has a 30.00 pack-year smoking history. He has  never used smokeless tobacco. He reports current drug use. Drug: Marijuana. He reports that he does not drink alcohol.  Allergies  Allergen Reactions   Grapefruit Extract    Lactose Other (See Comments)    GI intolerance    Spironolactone Other (See Comments)    Gynecomastia       Family History  Problem Relation Age of Onset   CAD Father     Prior to Admission medications   Medication Sig Start Date End Date Taking? Authorizing Provider  acetaminophen (TYLENOL) 325 MG tablet Take 2 tablets (650 mg total) by mouth every 4 (four) hours as needed for mild pain, fever or headache. 06/25/18   Shon Hale, MD  ALPRAZolam Prudy Feeler) 0.25 MG tablet Take 1 tablet (0.25 mg total) by mouth 3 (three) times daily as needed for anxiety or sleep. 06/25/18   Shon Hale, MD  amiodarone (PACERONE) 200 MG tablet Take 1 tablet (200 mg total) by mouth 2 (two) times daily. For 7 Days (last dose 07/02/2018), then 200 mg daily starting 07/03/2018........Marland KitchenFor Heart 06/25/18   Shon Hale, MD  apixaban (ELIQUIS) 5 MG TABS tablet Take 1 tablet (5 mg total) by mouth 2 (two) times daily. 06/25/18   Shon Hale, MD  atorvastatin (LIPITOR) 20 MG tablet Take 1 tablet (20 mg total) by mouth at bedtime. 06/25/18   Shon Hale, MD  busPIRone (BUSPAR) 5 MG tablet Take 1 tablet (5 mg total) by mouth  3 (three) times daily. For Anxiety 06/25/18   Shon Hale, MD  cetirizine (ZYRTEC) 10 MG tablet Take 5 mg by mouth daily.    [provider]  feeding supplement, ENSURE ENLIVE, (ENSURE ENLIVE) LIQD Take 237 mLs by mouth 2 (two) times daily between meals. 06/16/18   Sherryll Burger, Pratik D, DO  furosemide (LASIX) 20 MG tablet Take 1 tablet (20 mg total) by mouth every Tuesday, Thursday, Saturday, and Sunday. For Heart/Fluid 06/26/18 12/03/19  Emokpae, Courage, MD  Ipratropium-Albuterol (COMBIVENT RESPIMAT) 20-100 MCG/ACT AERS respimat Inhale 1 puff into the lungs every 6 (six) hours as needed for wheezing or  shortness of breath. Patient not taking: Reported on 06/19/2018 06/16/18   Maurilio Lovely D, DO  levothyroxine (SYNTHROID, LEVOTHROID) 112 MCG tablet Take 112 mcg by mouth every morning.    [provider]  metoprolol succinate (TOPROL XL) 25 MG 24 hr tablet Take 1 tablet (25 mg total) by mouth daily. For Heart 06/25/18 12/03/19  Shon Hale, MD  Multiple Vitamin (MULTIVITAMIN WITH MINERALS) TABS tablet Take 1 tablet by mouth daily.    [provider]  promethazine (PHENERGAN) 25 MG tablet Take 25 mg by mouth every 6 (six) hours as needed for nausea or vomiting.    [provider]  ramelteon (ROZEREM) 8 MG tablet Take 1 tablet (8 mg total) by mouth at bedtime. 08/14/22   Clark, Meghan R, PA-C  traZODone (DESYREL) 100 MG tablet Take 2 tablets (200 mg total) by mouth at bedtime. 06/25/18   Shon Hale, MD    Physical Exam: Vitals:   08/16/22 1610 08/16/22 0620 08/16/22 0640 08/16/22 0740  BP:  (!) 87/66 92/75 94/70   Pulse:  74 82 73  Resp:  17 20 15   Temp: 98.8 F (37.1 C)     TempSrc: Oral     SpO2:  96% 96% 98%   General:  Appears calm and comfortable and is in NAD, pale, frail, cachectic Eyes:  EOMI, normal lids, iris ENT:  grossly normal hearing, lips & tongue, mmm; edentulous Neck:  no LAD, masses or thyromegaly Cardiovascular:  RRR, no m/r/g. No LE edema.  Respiratory:   CTA bilaterally with no wheezes/rales/rhonchi.  Normal respiratory effort. Abdomen:  soft, NT, ND Skin:  no rash or induration seen on limited exam Musculoskeletal:  grossly normal tone BUE/BLE, good ROM, no bony abnormality Psychiatric:  blunted mood and affect, speech fluent and appropriate, AOx3 Neurologic:  CN 2-12 grossly intact, moves all extremities in coordinated fashion   Radiological Exams on Admission: Independently reviewed - see discussion in A/P where applicable  CT HEAD WO CONTRAST ( )  Result Date: 08/16/2022 CLINICAL DATA:  Altered mental status EXAM: CT HEAD  WITHOUT CONTRAST TECHNIQUE: Contiguous axial images were obtained from the base of the skull through the vertex without intravenous contrast. RADIATION DOSE REDUCTION: This exam was performed according to the departmental dose-optimization program which includes automated exposure control, adjustment of the mA and/or kV according to patient size and/or use of iterative reconstruction technique. COMPARISON:  CT brain 08/09/22 FINDINGS: Brain: Compared to prior exam there may be a new small extra-axial fluid collection along the left frontal convexity, likely a subdural hematoma (series 4, image 22) measuring 5 mm. While the prominence of subarachnoid space was present on prior imaging, the slightly hyperdense appearance is new from prior exam. No hydrocephalus. No CT evidence of an acute cortical infarct. Sequela of mild chronic microvascular ischemic change with generalized volume loss. No mass effect. Vascular: No hyperdense vessel  or unexpected calcification. Skull: Normal. Negative for fracture or focal lesion. Sinuses/Orbits: No middle ear or mastoid effusion. Paranasal sinuses are clear. Orbits are unremarkable. Other: None. IMPRESSION: Compared to prior exam there may be a new small extra-axial fluid collection along the left frontal convexity, likely a subdural hematoma. While the prominence of subarachnoid space was present on prior imaging, the slightly hyperdense appearance is new from prior exam. Recommend short interval follow-up head CT. Electronically Signed   By: Lorenza Cambridge M.D.   On: 08/16/2022 09:45   DG Chest 2 View  Result Date: 08/16/2022 CLINICAL DATA:  near syncope EXAM: CHEST - 2 VIEW COMPARISON:  Chest x-ray 08/13/2012 FINDINGS: Left chest wall dual lead pacemaker and defibrillator in similar position. The heart and mediastinal contours are unchanged. Aortic calcification. Hyperinflation of the lungs. Biapical pleural/pulmonary scarring. No focal consolidation. No pulmonary edema. No  pleural effusion. No pneumothorax. No acute osseous abnormality. IMPRESSION: 1. No active cardiopulmonary disease. 2. Aortic Atherosclerosis (ICD10-I70.0) and Emphysema (ICD10-J43.9). Electronically Signed   By: Tish Frederickson M.D.   On: 08/16/2022 03:31   DG Chest 2 View  Result Date: 08/14/2022 CLINICAL DATA:  Shortness of breath EXAM: CHEST - 2 VIEW COMPARISON:  Aug 09, 2022 FINDINGS: Stable mild cardiomegaly and AICD device. No pneumothorax. No nodules or masses. No focal infiltrates. Flattening of the diaphragm on the lateral view. IMPRESSION: Mild cardiomegaly and flattening of the diaphragm on the lateral view consistent with COPD. No other acute abnormalities. Electronically Signed   By: Gerome Sam III M.D.   On: 08/14/2022 12:02    EKG: Independently reviewed.  Afib with rate 78; nonspecific ST changes with no evidence of acute ischemia   Labs on Admission: I have personally reviewed the available labs and imaging studies at the time of the admission.  Pertinent labs:    K+ 3.4 BUN 23/Creatinine 1.77/GFR 39; 14/1.29/56 on 5/12; 17/1.13/>60 on 5/7 BNP 911.8; 722.9 on 5/12 HS troponin 42, 45 WBC 7.2 Hgb 9.5 Dig 1.2   Assessment and Plan: Principal Problem:   Postural dizziness with presyncope Active Problems:   CAD in native artery   Protein calorie malnutrition (HCC)   Chronic systolic (congestive) heart failure (HCC)   Atrial fibrillation, chronic (HCC)   Presence of biventricular automatic implantable cardioverter defibrillator   Hypothyroidism   Insomnia   AKI (acute kidney injury) (HCC)   DNR (do not resuscitate)    Presyncope -Patient with subacute insomnia, refractory to various medications -In the ER he suffered a presyncopal event -He was noted to have low BPs, improved with IVF, as well as AKI -Will observe overnight on telemetry in the hospital. -Will order head CT due to insomnia as well as presyncope (although likely related to hypovolemia) - this  appears to show a SDH so I have reached out to neurosurgery on call to review and advise (Dr. Danielle Dess) -Neuro checks  -PT/OT eval and treat  Insomnia -Patient with severe recent insomnia, acute onset about a month ago -Has tried various medications without relief -TTS consult today -Will try Seroquel 25 mg PO qhs -Delirium precautions  AKI -Normal renal function on 5/7, progressively worsening since -Likely hypovolemic in the setting of subacute and severe insomnia -Will gently rehydrate -Trend BMP -Avoid nephrotoxic agents  Chronic systolic CHF -2020 echo with EF 50-55% -Has AICD -Appears compensated, actually hypovolemic  Afib -Rate controlled with digoxin - although this is on hold in the setting of AKI - and Toprol XL - also on hold  given hypotension in the ER -Also holding Eliquis given concern for SDH  CAD -Continue Lipitor -No c/o chest pain at this time -Mildly elevated troponin with negative delta, doubt ACS  HTN -Hold Toprol XL given hypotension in the ER  Hypothyroidism -Continue Synthroid -Normal TSH on 5/7  Malnutrition -BMI is 16.64  -The patient has at least 2 indicators for malnutrition (insufficient energy intake, weight loss, loss of muscle mass, loss of subcutaneous fat).  -This is likely due to acute on chronic disease.   -Will obtain a nutrition consult for further recommendations.   DNR -I have discussed code status with the patient and his caregiver and  they are in agreement that the patient would not desire resuscitation and would prefer to die a natural death should that situation arise.     Advance Care Planning:   Code Status: DNR   Consults: Neurosurgery (possibly telephone only); Nutrition; PT/OT; TOC team  DVT Prophylaxis: SCDs  Family Communication: Caregiver was present throughout evaluation  Severity of Illness: The appropriate patient status for this patient is OBSERVATION. Observation status is judged to be reasonable and  necessary in order to provide the required intensity of service to ensure the patient's safety. The patient's presenting symptoms, physical exam findings, and initial radiographic and laboratory data in the context of their medical condition is felt to place them at decreased risk for further clinical deterioration. Furthermore, it is anticipated that the patient will be medically stable for discharge from the hospital within 2 midnights of admission.   Author: Jonah Blue, MD 08/16/2022 10:22 AM  For on call review [email or web address].

## 2022-08-16 NOTE — ED Notes (Signed)
ED TO INPATIENT HANDOFF REPORT  ED Nurse Name and Phone #: [phone]  S Name/Age/Gender Anthony Charles 77 y.o. male Room/Bed: 001C/001C  Code Status   Code Status: DNR  Home/SNF/Other Home Patient oriented to: self, place, time, and situation Is this baseline? Yes   Triage Complete: Triage complete  Chief Complaint Postural dizziness with presyncope [R42, R55]  Triage Note Patient coming to ED for evaluation of insomnia.  Reports he was recently seen and started on Ramelteon.  Caretakers reports "I think he is having an adverse reaction to the medication.  He has the jitters and is still unable to sleep."  No reports of chest pain, SHOB, itching, or rashes.  No new medication hx.     Allergies Allergies  Allergen Reactions   Grapefruit Extract    Lactose Other (See Comments)    GI intolerance    Spironolactone Other (See Comments)    Gynecomastia       Level of Care/Admitting Diagnosis ED Disposition     ED Disposition  Admit   Condition  --   Comment  Hospital Area: [facility] [[id number]]  Level of Care: Telemetry Medical [[id number]]  May place patient in observation at [facility] or [facility] if equivalent level of care is available:: Yes  Covid Evaluation: Asymptomatic - no recent exposure (last 10 days) testing not required  Diagnosis: Postural dizziness with presyncope [[id number]]  Admitting Physician: Jonah Blue [[id number]]  Attending Physician: Jonah Blue [[id number]]          B Medical/Surgery History Past Medical History:  Diagnosis Date   Agent orange exposure    AICD (automatic cardioverter/defibrillator) present    Aneurysm (HCC)    Atrial fibrillation (HCC)    CAD (coronary artery disease)    Diabetes mellitus without complication (HCC)    Hernia of abdominal wall    Hypertension    Thyroid disease    Past Surgical History:  Procedure Laterality Date   pacemaker/defibrillator       A IV Location/Drains/Wounds Patient  Lines/Drains/Airways Status     Active Line/Drains/Airways     Name Placement date Placement time Site Days   Peripheral IV 08/16/22 20 G Anterior;Proximal;Right Forearm 08/16/22  0514  Forearm  less than 1            Intake/Output Last 24 hours  Intake/Output Summary (Last 24 hours) at 08/16/2022 1207 Last data filed at 08/16/2022 0730 Gross per 24 hour  Intake 1000 ml  Output --  Net 1000 ml    Labs/Imaging Results for orders placed or performed during the hospital encounter of 08/16/22 (from the past 48 hour(s))  CBC with Differential     Status: Abnormal   Collection Time: 08/16/22  3:10 AM  Result Value Ref Range   WBC 7.2 4.0 - 10.5 K/uL   RBC 3.35 (L) 4.22 - 5.81 MIL/uL   Hemoglobin 9.5 (L) 13.0 - 17.0 g/dL   HCT 19.1 (L) 47.8 - 29.5 %   MCV 93.1 80.0 - 100.0 fL   MCH 28.4 26.0 - 34.0 pg   MCHC 30.4 30.0 - 36.0 g/dL   RDW 62.1 (H) 30.8 - 65.7 %   Platelets 311 150 - 400 K/uL   nRBC 0.0 0.0 - 0.2 %   Neutrophils Relative % 55 %   Neutro Abs 4.0 1.7 - 7.7 K/uL   Lymphocytes Relative 26 %   Lymphs Abs 1.8 0.7 - 4.0 K/uL   Monocytes Relative 13 %   Monocytes  Absolute 0.9 0.1 - 1.0 K/uL   Eosinophils Relative 5 %   Eosinophils Absolute 0.4 0.0 - 0.5 K/uL   Basophils Relative 1 %   Basophils Absolute 0.1 0.0 - 0.1 K/uL   Immature Granulocytes 0 %   Abs Immature Granulocytes 0.01 0.00 - 0.07 K/uL    Comment: Performed at [facility], [address] [place], [place] [address]  Basic metabolic panel     Status: Abnormal   Collection Time: 08/16/22  3:10 AM  Result Value Ref Range   Sodium 138 135 - 145 mmol/L   Potassium 3.4 (L) 3.5 - 5.1 mmol/L   Chloride 93 (L) 98 - 111 mmol/L   CO2 32 22 - 32 mmol/L   Glucose, Bld 104 (H) 70 - 99 mg/dL    Comment: Glucose reference range applies only to samples taken after fasting for at least 8 hours.   BUN 23 8 - 23 mg/dL   Creatinine, Ser 6.04 (H) 0.61 - 1.24 mg/dL   Calcium 8.9 8.9 - 54.0 mg/dL   GFR,  Estimated 39 (L) >60 mL/min    Comment: (NOTE) Calculated using the CKD-EPI Creatinine Equation (2021)    Anion gap 13 5 - 15    Comment: Performed at [facility], [address], [place], [place] [address]  Troponin I (High Sensitivity)     Status: Abnormal   Collection Time: 08/16/22  3:10 AM  Result Value Ref Range   Troponin I (High Sensitivity) 42 (H) <18 ng/L    Comment: (NOTE) Elevated high sensitivity troponin I (hsTnI) values and significant  changes across serial measurements may suggest ACS but many other  chronic and acute conditions are known to elevate hsTnI results.  Refer to the "Links" section for chest pain algorithms and additional  guidance. Performed at [facility], [address], [place], [place] [address]   Brain natriuretic peptide     Status: Abnormal   Collection Time: 08/16/22  3:10 AM  Result Value Ref Range   B Natriuretic Peptide 911.8 (H) 0.0 - 100.0 pg/mL    Comment: Performed at [facility], [address] [place], [place] [address]  Digoxin level     Status: None   Collection Time: 08/16/22  3:10 AM  Result Value Ref Range   Digoxin Level 1.2 0.8 - 2.0 ng/mL    Comment: Performed at [facility], [address], [place], [place] [address]  Troponin I (High Sensitivity)     Status: Abnormal   Collection Time: 08/16/22  5:01 AM  Result Value Ref Range   Troponin I (High Sensitivity) 45 (H) <18 ng/L    Comment: (NOTE) Elevated high sensitivity troponin I (hsTnI) values and significant  changes across serial measurements may suggest ACS but many other  chronic and acute conditions are known to elevate hsTnI results.  Refer to the "Links" section for chest pain algorithms and additional  guidance. Performed at [facility], [address], [place], [place] [address]   Hepatic function panel     Status: Abnormal   Collection Time: 08/16/22  5:01 AM  Result Value Ref Range   Total Protein 6.8 6.5 - 8.1 g/dL    Albumin 3.4 (L) 3.5 - 5.0 g/dL   AST 35 15 - 41 U/L   ALT 31 0 - 44 U/L   Alkaline Phosphatase 98 38 - 126 U/L   Total Bilirubin 0.7 0.3 - 1.2 mg/dL  Bilirubin, Direct 0.2 0.0 - 0.2 mg/dL   Indirect Bilirubin 0.5 0.3 - 0.9 mg/dL    Comment: Performed at [facility], [address], [place], [place] [address]  Lipase, blood     Status: Abnormal   Collection Time: 08/16/22  5:01 AM  Result Value Ref Range   Lipase 52 (H) 11 - 51 U/L    Comment: Performed at [facility], [address], [place], [place] [address]   CT HEAD WO CONTRAST ( )  Result Date: 08/16/2022 CLINICAL DATA:  Altered mental status EXAM: CT HEAD WITHOUT CONTRAST TECHNIQUE: Contiguous axial images were obtained from the base of the skull through the vertex without intravenous contrast. RADIATION DOSE REDUCTION: This exam was performed according to the departmental dose-optimization program which includes automated exposure control, adjustment of the mA and/or kV according to patient size and/or use of iterative reconstruction technique. COMPARISON:  CT brain 08/09/22 FINDINGS: Brain: Compared to prior exam there may be a new small extra-axial fluid collection along the left frontal convexity, likely a subdural hematoma (series 4, image 22) measuring 5 mm. While the prominence of subarachnoid space was present on prior imaging, the slightly hyperdense appearance is new from prior exam. No hydrocephalus. No CT evidence of an acute cortical infarct. Sequela of mild chronic microvascular ischemic change with generalized volume loss. No mass effect. Vascular: No hyperdense vessel or unexpected calcification. Skull: Normal. Negative for fracture or focal lesion. Sinuses/Orbits: No middle ear or mastoid effusion. Paranasal sinuses are clear. Orbits are unremarkable. Other: None. IMPRESSION: Compared to prior exam there may be a new small extra-axial fluid collection along the left frontal convexity, likely a subdural hematoma.  While the prominence of subarachnoid space was present on prior imaging, the slightly hyperdense appearance is new from prior exam. Recommend short interval follow-up head CT. Electronically Signed   By: Lorenza Cambridge M.D.   On: 08/16/2022 09:45   DG Chest 2 View  Result Date: 08/16/2022 CLINICAL DATA:  near syncope EXAM: CHEST - 2 VIEW COMPARISON:  Chest x-ray 08/13/2012 FINDINGS: Left chest wall dual lead pacemaker and defibrillator in similar position. The heart and mediastinal contours are unchanged. Aortic calcification. Hyperinflation of the lungs. Biapical pleural/pulmonary scarring. No focal consolidation. No pulmonary edema. No pleural effusion. No pneumothorax. No acute osseous abnormality. IMPRESSION: 1. No active cardiopulmonary disease. 2. Aortic Atherosclerosis (ICD10-I70.0) and Emphysema (ICD10-J43.9). Electronically Signed   By: Tish Frederickson M.D.   On: 08/16/2022 03:31    Pending Labs Unresulted Labs (From admission, onward)     Start     Ordered   08/17/22 0500  Basic metabolic panel  Tomorrow morning,   R        08/16/22 0932   08/17/22 0500  CBC  Tomorrow morning,   R        08/16/22 0932            Vitals/Pain Today's Vitals   08/16/22 0740 08/16/22 1023 08/16/22 1150 08/16/22 1155  BP: 94/70 103/81 97/71   Pulse: 73 85 (!) 53   Resp: 15 18 18    Temp:    97.9 F (36.6 C)  TempSrc:    Oral  SpO2: 98% 95% 95%   PainSc:        Isolation Precautions No active isolations  Medications Medications  QUEtiapine (SEROQUEL) tablet 25 mg (has no administration in time range)  atorvastatin (LIPITOR) tablet 20 mg (has no administration in time range)  levothyroxine (SYNTHROID) tablet 100 mcg (  100 mcg Oral Given 08/16/22 1011)  sodium chloride flush (NS) 0.9 % injection 3 mL (3 mLs Intravenous Not Given 08/16/22 1016)  lactated ringers infusion ( Intravenous New Bag/Given 08/16/22 1010)  acetaminophen (TYLENOL) tablet 650 mg (has no administration in time range)     Or  acetaminophen (TYLENOL) suppository 650 mg (has no administration in time range)  docusate sodium (COLACE) capsule 100 mg (100 mg Oral Given 08/16/22 1011)  polyethylene glycol (MIRALAX / GLYCOLAX) packet 17 g (has no administration in time range)  bisacodyl (DULCOLAX) EC tablet 5 mg (has no administration in time range)  ondansetron (ZOFRAN) tablet 4 mg (has no administration in time range)    Or  ondansetron (ZOFRAN) injection 4 mg (has no administration in time range)  hydrALAZINE (APRESOLINE) injection 5 mg (has no administration in time range)  loratadine (CLARITIN) tablet 10 mg (10 mg Oral Given 08/16/22 1021)  lactated ringers bolus 1,000 mL (0 mLs Intravenous Stopped 08/16/22 0730)  LORazepam (ATIVAN) tablet 0.5 mg (0.5 mg Oral Given 08/16/22 5284)    Mobility walks with person assist     Focused Assessments Neuro Assessment Handoff:  Swallow screen pass? Yes          Neuro Assessment: Exceptions to WDL Neuro Checks:      Has TPA been given? No If patient is a Neuro Trauma and patient is going to OR before floor call report to 4N Charge nurse: [phone] or [phone]   R Recommendations: See Admitting Provider Note  Report given to:   Additional Notes:

## 2022-08-16 NOTE — ED Notes (Signed)
Pt brought back to triage by clinical staff. Per visitor with pt, pt went to use the bathroom and then called visitor for assistance. Upon getting to pt, visitor states that pt had a near-syncopal event. Pt VS re-evaluated, EKG performed, RN notified.

## 2022-08-16 NOTE — Discharge Planning (Signed)
Pt active at [facility] Medical Provider: Marlyne Beards SW: Otho Ket  Desk phone: [phone] Please call [facility] for additional information [phone]

## 2022-08-16 NOTE — ED Triage Notes (Signed)
Patient coming to ED for evaluation of insomnia.  Reports he was recently seen and started on Ramelteon.  Caretakers reports "I think he is having an adverse reaction to the medication.  He has the jitters and is still unable to sleep."  No reports of chest pain, SHOB, itching, or rashes.  No new medication hx.

## 2022-08-17 DIAGNOSIS — F17210 Nicotine dependence, cigarettes, uncomplicated: Secondary | ICD-10-CM | POA: Diagnosis present

## 2022-08-17 DIAGNOSIS — R55 Syncope and collapse: Secondary | ICD-10-CM | POA: Diagnosis not present

## 2022-08-17 DIAGNOSIS — N179 Acute kidney failure, unspecified: Secondary | ICD-10-CM

## 2022-08-17 DIAGNOSIS — Z7901 Long term (current) use of anticoagulants: Secondary | ICD-10-CM | POA: Diagnosis not present

## 2022-08-17 DIAGNOSIS — I2510 Atherosclerotic heart disease of native coronary artery without angina pectoris: Secondary | ICD-10-CM

## 2022-08-17 DIAGNOSIS — J449 Chronic obstructive pulmonary disease, unspecified: Secondary | ICD-10-CM | POA: Diagnosis present

## 2022-08-17 DIAGNOSIS — E860 Dehydration: Secondary | ICD-10-CM | POA: Diagnosis present

## 2022-08-17 DIAGNOSIS — I5022 Chronic systolic (congestive) heart failure: Secondary | ICD-10-CM | POA: Diagnosis present

## 2022-08-17 DIAGNOSIS — R42 Dizziness and giddiness: Secondary | ICD-10-CM | POA: Diagnosis not present

## 2022-08-17 DIAGNOSIS — Z95810 Presence of automatic (implantable) cardiac defibrillator: Secondary | ICD-10-CM | POA: Diagnosis not present

## 2022-08-17 DIAGNOSIS — E43 Unspecified severe protein-calorie malnutrition: Secondary | ICD-10-CM | POA: Diagnosis present

## 2022-08-17 DIAGNOSIS — E861 Hypovolemia: Secondary | ICD-10-CM | POA: Diagnosis present

## 2022-08-17 DIAGNOSIS — I482 Chronic atrial fibrillation, unspecified: Secondary | ICD-10-CM

## 2022-08-17 DIAGNOSIS — G929 Unspecified toxic encephalopathy: Secondary | ICD-10-CM | POA: Diagnosis not present

## 2022-08-17 DIAGNOSIS — E739 Lactose intolerance, unspecified: Secondary | ICD-10-CM | POA: Diagnosis present

## 2022-08-17 DIAGNOSIS — E039 Hypothyroidism, unspecified: Secondary | ICD-10-CM | POA: Diagnosis present

## 2022-08-17 DIAGNOSIS — Z8249 Family history of ischemic heart disease and other diseases of the circulatory system: Secondary | ICD-10-CM | POA: Diagnosis not present

## 2022-08-17 DIAGNOSIS — E119 Type 2 diabetes mellitus without complications: Secondary | ICD-10-CM | POA: Diagnosis present

## 2022-08-17 DIAGNOSIS — F419 Anxiety disorder, unspecified: Secondary | ICD-10-CM | POA: Diagnosis present

## 2022-08-17 DIAGNOSIS — I959 Hypotension, unspecified: Secondary | ICD-10-CM | POA: Diagnosis present

## 2022-08-17 DIAGNOSIS — G4700 Insomnia, unspecified: Secondary | ICD-10-CM | POA: Diagnosis present

## 2022-08-17 DIAGNOSIS — Z681 Body mass index (BMI) 19 or less, adult: Secondary | ICD-10-CM | POA: Diagnosis not present

## 2022-08-17 DIAGNOSIS — I110 Hypertensive heart disease with heart failure: Secondary | ICD-10-CM | POA: Diagnosis present

## 2022-08-17 DIAGNOSIS — I6200 Nontraumatic subdural hemorrhage, unspecified: Secondary | ICD-10-CM | POA: Diagnosis present

## 2022-08-17 DIAGNOSIS — F05 Delirium due to known physiological condition: Secondary | ICD-10-CM | POA: Diagnosis not present

## 2022-08-17 DIAGNOSIS — Z66 Do not resuscitate: Secondary | ICD-10-CM | POA: Diagnosis present

## 2022-08-17 LAB — BASIC METABOLIC PANEL
Anion gap: 5 (ref 5–15)
BUN: 17 mg/dL (ref 8–23)
CO2: 31 mmol/L (ref 22–32)
Calcium: 8.7 mg/dL — ABNORMAL LOW (ref 8.9–10.3)
Chloride: 101 mmol/L (ref 98–111)
Creatinine, Ser: 1.28 mg/dL — ABNORMAL HIGH (ref 0.61–1.24)
GFR, Estimated: 57 mL/min — ABNORMAL LOW (ref >60)
Glucose, Bld: 89 mg/dL (ref 70–99)
Potassium: 3.4 mmol/L — ABNORMAL LOW (ref 3.5–5.1)
Sodium: 137 mmol/L (ref 135–145)

## 2022-08-17 LAB — CBC
HCT: 27.6 % — ABNORMAL LOW (ref 39.0–52.0)
Hemoglobin: 8.6 g/dL — ABNORMAL LOW (ref 13.0–17.0)
MCH: 28.4 pg (ref 26.0–34.0)
MCHC: 31.2 g/dL (ref 30.0–36.0)
MCV: 91.1 fL (ref 80.0–100.0)
Platelets: 248 10*3/uL (ref 150–400)
RBC: 3.03 MIL/uL — ABNORMAL LOW (ref 4.22–5.81)
RDW: 15.9 % — ABNORMAL HIGH (ref 11.5–15.5)
WBC: 6.7 10*3/uL (ref 4.0–10.5)
nRBC: 0 % (ref 0.0–0.2)

## 2022-08-17 MED ORDER — ENSURE ENLIVE PO LIQD: 237.0000 mL | Freq: Two times a day (BID) | ORAL | Status: DC

## 2022-08-17 MED ORDER — LORAZEPAM 0.5 MG PO TABS
0.5000 mg | ORAL_TABLET | ORAL | Status: DC | PRN
  Administered 2022-08-17: 1 mg via ORAL
  Filled 2022-08-17: qty 2

## 2022-08-17 MED ORDER — MELATONIN 3 MG PO TABS
3.0000 mg | ORAL_TABLET | Freq: Every evening | ORAL | Status: DC | PRN
  Administered 2022-08-17: 3 mg via ORAL
  Filled 2022-08-17: qty 1

## 2022-08-17 MED ORDER — MELATONIN 5 MG PO TABS
5.0000 mg | ORAL_TABLET | Freq: Every day | ORAL | Status: DC
  Administered 2022-08-17: 5 mg via ORAL
  Filled 2022-08-17: qty 1

## 2022-08-17 MED ORDER — ADULT MULTIVITAMIN W/MINERALS CH
1.0000 | ORAL_TABLET | Freq: Every day | ORAL | Status: DC
  Administered 2022-08-17 – 2022-08-18 (×2): 1 via ORAL
  Filled 2022-08-17 (×2): qty 1

## 2022-08-17 MED ORDER — ORAL CARE MOUTH RINSE: 15.0000 mL | OROMUCOSAL | Status: DC | PRN

## 2022-08-17 MED ORDER — QUETIAPINE FUMARATE 50 MG PO TABS
50.0000 mg | ORAL_TABLET | Freq: Every day | ORAL | Status: DC
  Administered 2022-08-17: 50 mg via ORAL
  Filled 2022-08-17: qty 1

## 2022-08-17 MED ORDER — ZOLPIDEM TARTRATE 5 MG PO TABS
5.0000 mg | ORAL_TABLET | Freq: Once | ORAL | Status: AC
  Administered 2022-08-17: 5 mg via ORAL
  Filled 2022-08-17: qty 1

## 2022-08-17 NOTE — Progress Notes (Signed)
Initial Nutrition Assessment  DOCUMENTATION CODES:   Severe malnutrition in context of chronic illness, Underweight  INTERVENTION:  Liberalize pt diet to regular due to malnutrition.  Ensure Enlive po BID, each supplement provides 350 kcal and 20 grams of protein. Multivitamin w/ minerals daily Encourage good PO intake   NUTRITION DIAGNOSIS:   Severe Malnutrition related to chronic illness (CHF, COPD) as evidenced by severe muscle depletion, severe fat depletion  GOAL:   Patient will meet greater than or equal to 90% of their needs  MONITOR:   PO intake, Supplement acceptance, Labs, I & O's  REASON FOR ASSESSMENT:   Consult  (Nutritional Goals)  ASSESSMENT:   77 y.o. male presented to the ED with insomnia. PMH includes CHF, CAD, HTN, agent orange exposure, COPD, DM, and A. Fib. Pt admitted with presyncope, AKI, and insomnia.  Pt sleeping at time of RD visit. Opened eyes to RD, but quickly shut them again. No family at bedside. RD observed lunch tray at bedside, untouched. Spoke with RN outside of room. Reports that pt did not eat breakfast this morning either.  Per EMR, pt has had a 2% weight loss within 1 week; this is clinically significant for time frame. Per home meds list, pt is on a diuretic and could have contributed to weight loss.   Pt meets criteria for malnutrition based on physical exam. Pt would benefit from oral nutrition supplements due to missed meals and malnutrition. Chart with noted lactose intolerance, all supplements are suitable for lactose intolerance.  Medications reviewed and include: Colace  Labs reviewed: Sodium 137, Potassium 3.4, BUN 17, Creatinine 1.28   NUTRITION - FOCUSED PHYSICAL EXAM:  Flowsheet Row Most Recent Value  Orbital Region Severe depletion  Upper Arm Region Severe depletion  Thoracic and Lumbar Region Severe depletion  Buccal Region Severe depletion  Temple Region Severe depletion  Clavicle Bone Region Severe depletion   Clavicle and Acromion Bone Region Severe depletion  Scapular Bone Region Severe depletion  Dorsal Hand Severe depletion  Patellar Region Severe depletion  Anterior Thigh Region Severe depletion  Posterior Calf Region Severe depletion  Edema (RD Assessment) None  Hair Reviewed  Eyes Unable to assess  Mouth Unable to assess  Skin Reviewed  Nails Reviewed   Diet Order:   Diet Order             Diet regular Room service appropriate? Yes with Assist; Fluid consistency: Thin  Diet effective now                  EDUCATION NEEDS:  Not appropriate for education at this time  Skin:  Skin Assessment: Reviewed RN Assessment  Last BM:  Unknown  Height:  Ht Readings from Last 1 Encounters:  08/16/22 5\' 10"  (1.778 m)   Weight:  Wt Readings from Last 1 Encounters:  08/16/22 52 kg   Ideal Body Weight:  75.5 kg  BMI:  Body mass index is 16.45 kg/m.  Estimated Nutritional Needs:  Kcal:  1600-1800 Protein:  80-100 gram Fluid:  >/= 1.6 L   Kirby Crigler RD, LDN Clinical Dietitian See [facility] for contact information.

## 2022-08-17 NOTE — Progress Notes (Signed)
Patient now resting comfortably in bed

## 2022-08-17 NOTE — Progress Notes (Addendum)
Patient still c/o insomina following Seroquel dose. Administered melatonin with still no relief. Ativan ordered and patient rested for almost two hours. Upon arousal, patient confused and unable to recall previous events that happened during the night. Patient removed PIV and telemetry and is refusing for staff to replace it. Patient adamant that he wanted to leave the hospital and was wondering in the hallways until speaking with his wife who encouraged him to stay. Dr. Julian Reil notified of above events and Ambien ordered in hopes to help the patient rest. Patient refusing telemetry still but did allow staff to obtain his blood pressure. Will continue to monitor.

## 2022-08-17 NOTE — Progress Notes (Signed)
PROGRESS NOTE    Anthony Charles  RUE:[id number] DOB: Feb 13, 1946 DOA: 08/16/2022 PCP: [facility]   Chief Complaint  Patient presents with   Insomnia    Brief Narrative:    Anthony Charles is a 77 y.o. male with medical history significant of agent orange exposure, chronic systolic CHF with AICD, afib, CAD, DM, HTN, and hypothyroidism presenting with insomnia. For the last month, he has been having insomnia, SOB.  He has been prescribed various sleep medications and they haven't worked.  He was recently given Ramelteon and it worked only one night.  He has been eating and drinking well, not sure why he has AKI.  He is not really feeling light-headed or dizzy.  He currently feels fatigued, anxious.  "I feel like a dog." In the ER, his BP dropped and he became near-syncopal in the bathroom.  TTS was consulted and recommended addition of Seroquel qhs.   This is his 4th ED visit of the week for insomnia.  Not improving with an array of medications.  Pre-syncopal episode here, remained hypotensive.  BP in 70s. Given 2L IVF.  Still a little low.  Creatinine 1.7, usually <1.  Seen by psych, ordered Seroquel, Ativan.  Interrogating pacer.      Assessment & Plan:   Principal Problem:   Postural dizziness with presyncope Active Problems:   CAD in native artery   Protein calorie malnutrition (HCC)   Chronic systolic (congestive) heart failure (HCC)   Atrial fibrillation, chronic (HCC)   Presence of biventricular automatic implantable cardioverter defibrillator   Hypothyroidism   Insomnia   AKI (acute kidney injury) (HCC)   DNR (do not resuscitate)   Protein-calorie malnutrition, severe   Presyncope -Patient with subacute insomnia, refractory to various medications -In the ER he suffered a presyncopal event -Continue with IV fluids, he is not orthostatic anymore  Acute toxic encephalopathy/Hospital delirium -Is more confused this morning, appears to be in hospital delirium, as  well he is intermittently somnolent, as well this is most likely due to him receiving IV Ativan at 2 AM, and Ambien around 5:30 AM. -Continue with delirium precaution -low Threshold to repeat CT head in the setting of SDH diagnosis  SDH -Neurosurgery input greatly appreciated, not a surgical candidate, continue with observation and medical management, continue to hold Eliquis till cleared by neurosurgery  Insomnia -Patient with severe recent insomnia, acute onset about a month ago -Crease his Seroquel to 50 mg at bedtime, and continue with melatonin -Delirium precautions   AKI -Peaked at 1.7 on admission, it is 1.3 today, improving, continue with IV fluids as he is somnolent and with very poor oral intake    Chronic systolic CHF -2020 echo with EF 50-55% -Has AICD -Appears compensated, actually hypovolemic, continue with gentle hydration   Afib -Rate controlled with digoxin - although this is on hold in the setting of AKI - and Toprol XL - also on hold given hypotension in the ER -Also holding Eliquis given concern for SDH   CAD -Continue Lipitor -No c/o chest pain at this time -Mildly elevated troponin with negative delta, doubt ACS   HTN -Hold Toprol XL given hypotension in the ER   Hypothyroidism -Continue Synthroid -Normal TSH on 5/7   Severe protein calorie malnutrition -BMI is 16.64  -The patient has at least 2 indicators for malnutrition (insufficient energy intake, weight loss, loss of muscle mass, loss of subcutaneous fat).  -This is likely due to acute on chronic disease.  DVT prophylaxis: SCD Code Status: DNR Family Communication: none at bedside Disposition: Intermittent altered, somnolent, on IV fluids, very poor oral intake, he is confused and mentation not at baseline   Consultants:  Neurosurgery Psychiatry   Subjective:  Fused this morning and unable to give reliable complaints, but as per staff he had difficulty sleeping where he  received IV Ativan and Ambien earlier in the day.  Objective: Vitals:   08/17/22 1052 08/17/22 1054 08/17/22 1058 08/17/22 1141  BP: 111/84 112/70 126/65 102/60  Pulse:      Resp:    (!) 22  Temp:    98.2 F (36.8 C)  TempSrc:    Oral  SpO2:    98%  Weight:      Height:        Intake/Output Summary (Last 24 hours) at 08/17/2022 1306 Last data filed at 08/17/2022 0300 Gross per 24 hour  Intake 1038.68 ml  Output --  Net 1038.68 ml   Filed Weights   08/16/22 1511  Weight: 52 kg    Examination: Patient is somnolent, confused, extremely frail, cachectic, chronically ill-appearing symmetrical Chest wall movement, Good air movement bilaterally, CTAB RRR,No Gallops,Rubs or new Murmurs, No Parasternal Heave +ve B.Sounds, Abd Soft, No tenderness, No rebound - guarding or rigidity. No Cyanosis, Clubbing or edema, No new Rash or bruise      Data Reviewed: I have personally reviewed following labs and imaging studies  CBC: Recent Labs  Lab 08/14/22 1205 08/16/22 0310 08/17/22 0740  WBC 7.7 7.2 6.7  NEUTROABS 5.2 4.0  --   HGB 9.5* 9.5* 8.6*  HCT 31.2* 31.2* 27.6*  MCV 93.4 93.1 91.1  PLT 325 311 248    Basic Metabolic Panel: Recent Labs  Lab 08/14/22 1205 08/16/22 0310 08/17/22 0740  NA 139 138 137  K 3.5 3.4* 3.4*  CL 93* 93* 101  CO2 34* 32 31  GLUCOSE 112* 104* 89  BUN 14 23 17   CREATININE 1.29* 1.77* 1.28*  CALCIUM 8.9 8.9 8.7*    GFR: Estimated Creatinine Clearance: 34.4 mL/min (A) (by C-G formula based on SCr of 1.28 mg/dL (H)).  Liver Function Tests: Recent Labs  Lab 08/16/22 0501  AST 35  ALT 31  ALKPHOS 98  BILITOT 0.7  PROT 6.8  ALBUMIN 3.4*    CBG: No results for input(s): "GLUCAP" in the last 168 hours.   No results found for this or any previous visit (from the past 240 hour(s)).       Radiology Studies: CT HEAD WO CONTRAST ( )  Result Date: 08/16/2022 CLINICAL DATA:  Altered mental status EXAM: CT HEAD WITHOUT CONTRAST  TECHNIQUE: Contiguous axial images were obtained from the base of the skull through the vertex without intravenous contrast. RADIATION DOSE REDUCTION: This exam was performed according to the departmental dose-optimization program which includes automated exposure control, adjustment of the mA and/or kV according to patient size and/or use of iterative reconstruction technique. COMPARISON:  CT brain 08/09/22 FINDINGS: Brain: Compared to prior exam there may be a new small extra-axial fluid collection along the left frontal convexity, likely a subdural hematoma (series 4, image 22) measuring 5 mm. While the prominence of subarachnoid space was present on prior imaging, the slightly hyperdense appearance is new from prior exam. No hydrocephalus. No CT evidence of an acute cortical infarct. Sequela of mild chronic microvascular ischemic change with generalized volume loss. No mass effect. Vascular: No hyperdense vessel or unexpected calcification. Skull: Normal. Negative for fracture or focal lesion.  Sinuses/Orbits: No middle ear or mastoid effusion. Paranasal sinuses are clear. Orbits are unremarkable. Other: None. IMPRESSION: Compared to prior exam there may be a new small extra-axial fluid collection along the left frontal convexity, likely a subdural hematoma. While the prominence of subarachnoid space was present on prior imaging, the slightly hyperdense appearance is new from prior exam. Recommend short interval follow-up head CT. Electronically Signed   By: Lorenza Cambridge M.D.   On: 08/16/2022 09:45   DG Chest 2 View  Result Date: 08/16/2022 CLINICAL DATA:  near syncope EXAM: CHEST - 2 VIEW COMPARISON:  Chest x-ray 08/13/2012 FINDINGS: Left chest wall dual lead pacemaker and defibrillator in similar position. The heart and mediastinal contours are unchanged. Aortic calcification. Hyperinflation of the lungs. Biapical pleural/pulmonary scarring. No focal consolidation. No pulmonary edema. No pleural effusion. No  pneumothorax. No acute osseous abnormality. IMPRESSION: 1. No active cardiopulmonary disease. 2. Aortic Atherosclerosis (ICD10-I70.0) and Emphysema (ICD10-J43.9). Electronically Signed   By: Tish Frederickson M.D.   On: 08/16/2022 03:31        Scheduled Meds:  atorvastatin  20 mg Oral QHS   docusate sodium  100 mg Oral BID   feeding supplement  237 mL Oral BID BM   levothyroxine  100 mcg Oral Q0600   loratadine  10 mg Oral Daily   multivitamin with minerals  1 tablet Oral Daily   QUEtiapine  25 mg Oral QHS   sodium chloride flush  3 mL Intravenous Q12H   Continuous Infusions:  lactated ringers 75 mL/hr at 08/17/22 1232     LOS: 0 days       Huey Bienenstock, MD [facility]   To contact the attending provider between 7A-7P or the covering provider during after hours 7P-7A, please log into the web site [email or web address] and access using universal [place] password for that web site. If you do not have the password, please call the hospital operator.  08/17/2022, 1:06 PM

## 2022-08-18 ENCOUNTER — Other Ambulatory Visit (HOSPITAL_COMMUNITY): Payer: Self-pay

## 2022-08-18 DIAGNOSIS — R55 Syncope and collapse: Secondary | ICD-10-CM | POA: Diagnosis not present

## 2022-08-18 DIAGNOSIS — R42 Dizziness and giddiness: Secondary | ICD-10-CM | POA: Diagnosis not present

## 2022-08-18 DIAGNOSIS — I482 Chronic atrial fibrillation, unspecified: Secondary | ICD-10-CM | POA: Diagnosis not present

## 2022-08-18 DIAGNOSIS — N179 Acute kidney failure, unspecified: Secondary | ICD-10-CM | POA: Diagnosis not present

## 2022-08-18 LAB — CBC WITH DIFFERENTIAL/PLATELET
Abs Immature Granulocytes: 0.02 10*3/uL (ref 0.00–0.07)
Basophils Absolute: 0 10*3/uL (ref 0.0–0.1)
Basophils Relative: 1 %
Eosinophils Absolute: 0.2 10*3/uL (ref 0.0–0.5)
Eosinophils Relative: 4 %
HCT: 27.4 % — ABNORMAL LOW (ref 39.0–52.0)
Hemoglobin: 8.6 g/dL — ABNORMAL LOW (ref 13.0–17.0)
Immature Granulocytes: 0 %
Lymphocytes Relative: 20 %
Lymphs Abs: 1.3 10*3/uL (ref 0.7–4.0)
MCH: 29.2 pg (ref 26.0–34.0)
MCHC: 31.4 g/dL (ref 30.0–36.0)
MCV: 92.9 fL (ref 80.0–100.0)
Monocytes Absolute: 0.8 10*3/uL (ref 0.1–1.0)
Monocytes Relative: 12 %
Neutro Abs: 3.9 10*3/uL (ref 1.7–7.7)
Neutrophils Relative %: 63 %
Platelets: 237 10*3/uL (ref 150–400)
RBC: 2.95 MIL/uL — ABNORMAL LOW (ref 4.22–5.81)
RDW: 15.7 % — ABNORMAL HIGH (ref 11.5–15.5)
WBC: 6.2 10*3/uL (ref 4.0–10.5)
nRBC: 0 % (ref 0.0–0.2)

## 2022-08-18 LAB — BASIC METABOLIC PANEL
Anion gap: 7 (ref 5–15)
BUN: 14 mg/dL (ref 8–23)
CO2: 30 mmol/L (ref 22–32)
Calcium: 8.6 mg/dL — ABNORMAL LOW (ref 8.9–10.3)
Chloride: 99 mmol/L (ref 98–111)
Creatinine, Ser: 1.21 mg/dL (ref 0.61–1.24)
GFR, Estimated: >60 mL/min (ref >60)
Glucose, Bld: 101 mg/dL — ABNORMAL HIGH (ref 70–99)
Potassium: 3.3 mmol/L — ABNORMAL LOW (ref 3.5–5.1)
Sodium: 136 mmol/L (ref 135–145)

## 2022-08-18 LAB — MAGNESIUM: Magnesium: 1.9 mg/dL (ref 1.7–2.4)

## 2022-08-18 LAB — PHOSPHORUS: Phosphorus: 2.8 mg/dL (ref 2.5–4.6)

## 2022-08-18 MED ORDER — ACETAMINOPHEN 325 MG PO TABS: 650.0000 mg | ORAL_TABLET | Freq: Four times a day (QID) | ORAL | Status: AC | PRN

## 2022-08-18 MED ORDER — TORSEMIDE 20 MG PO TABS: 10.0000 mg | ORAL_TABLET | ORAL | Status: AC

## 2022-08-18 MED ORDER — ZOLPIDEM TARTRATE 5 MG PO TABS
5.0000 mg | ORAL_TABLET | Freq: Every evening | ORAL | 0 refills | Status: AC | PRN
  Filled 2022-08-18: qty 15, 15d supply, fill #0

## 2022-08-18 MED ORDER — METOPROLOL SUCCINATE ER 100 MG PO TB24: 50.0000 mg | ORAL_TABLET | Freq: Every day | ORAL | Status: AC

## 2022-08-18 MED ORDER — HYDROXYZINE HCL 25 MG PO TABS
25.0000 mg | ORAL_TABLET | Freq: Every evening | ORAL | Status: DC | PRN
  Administered 2022-08-18: 25 mg via ORAL
  Filled 2022-08-18: qty 1

## 2022-08-18 NOTE — Discharge Instructions (Signed)
Follow with Primary MD [facility] in 7 days  ° °Get CBC, CMP,  checked  by Primary MD next visit.  ° ° °Activity: As tolerated with Full fall precautions use walker/cane & assistance as needed ° ° °Disposition Home  ° ° °Diet: Heart Healthy  , with feeding assistance and aspiration precautions. ° °For Heart failure patients - Check your Weight same time everyday, if you gain over 2 pounds, or you develop in leg swelling, experience more shortness of breath or chest pain, call your Primary MD immediately. Follow Cardiac Low Salt Diet and 1.5 lit/day fluid restriction. ° ° °On your next visit with your primary care physician please Get Medicines reviewed and adjusted. ° ° °Please request your Prim.MD to go over all Hospital Tests and Procedure/Radiological results at the follow up, please get all Hospital records sent to your Prim MD by signing hospital release before you go home. ° ° °If you experience worsening of your admission symptoms, develop shortness of breath, life threatening emergency, suicidal or homicidal thoughts you must seek medical attention immediately by calling 911 or calling your MD immediately  if symptoms less severe. ° °You Must read complete instructions/literature along with all the possible adverse reactions/side effects for all the Medicines you take and that have been prescribed to you. Take any new Medicines after you have completely understood and accpet all the possible adverse reactions/side effects.  ° °Do not drive, operating heavy machinery, perform activities at heights, swimming or participation in water activities or provide baby sitting services if your were admitted for syncope or siezures until you have seen by Primary MD or a Neurologist and advised to do so again. ° °Do not drive when taking Pain medications.  ° ° °Do not take more than prescribed Pain, Sleep and Anxiety Medications ° °Special Instructions: If you have smoked or chewed Tobacco  in the last 2 yrs  please stop smoking, stop any regular Alcohol  and or any Recreational drug use. ° °Wear Seat belts while driving. ° ° °Please note ° °You were cared for by a hospitalist during your hospital stay. If you have any questions about your discharge medications or the care you received while you were in the hospital after you are discharged, you can call the unit and asked to speak with the hospitalist on call if the hospitalist that took care of you is not available. Once you are discharged, your primary care physician will handle any further medical issues. Please note that NO REFILLS for any discharge medications will be authorized once you are discharged, as it is imperative that you return to your primary care physician (or establish a relationship with a primary care physician if you do not have one) for your aftercare needs so that they can reassess your need for medications and monitor your lab values. ° °

## 2022-08-18 NOTE — Progress Notes (Signed)
[facility] night cross cover note:   I was notified by RN that the patient continues to have anxiety and difficulty sleeping.  This is after receiving a dose of prn melatonin earlier tonight.  Per my chart review, it appears that the patient received a dose of Ativan last evening to address his insomnia and anxiety, but experienced a paradoxical reaction to this medication, noting that he subsequently became agitated, more confused, and still could not sleep.   With this history in mind, I will refrain from additional benzodiazepines at this time.  Rather, I ordered hydroxyzine 25 mg p.o. nightly as needed for anxiety or insomnia, and communicated this plan to the patient's RN .     Newton Pigg, DO Hospitalist

## 2022-08-18 NOTE — Discharge Summary (Signed)
Physician Discharge Summary  Lavon Richards BJY:[id number] DOB: Apr 02, 1946 DOA: 08/16/2022  PCP: [facility]  Admit date: 08/16/2022 Discharge date: 08/18/2022  Admitted From: (Home) Disposition:  (Home)  Recommendations for Outpatient Follow-up:  Follow up with PCP in 1-2 weeks Please obtain BMP/CBC in one week  Discharge Condition: (Stable) CODE STATUS: ( DNR) Diet recommendation: Heart Healthy  Brief/Interim Summary:   Anthony Charles is a 77 y.o. male with medical history significant of agent orange exposure, chronic systolic CHF with AICD, afib, CAD, DM, HTN, and hypothyroidism presenting with insomnia. For the last month, he has been having insomnia, SOB.  As well he had an episode of presyncope, had 4 ED visits in for last week for insomnia, has been tried on different medications by his PCP, his workup was significant for dehydration, AKI with creatinine of 1.7, he was admitted for further workup.      Presyncope -Patient with subacute insomnia, refractory to various medications he does appear to be with AKI, dehydrated, he was kept on IV fluids, antihypertensive regimen and diuresis has been held, he was kept on IV fluids, blood pressure has been stable, he is not orthostatic anymore at time of discharge, no significant events on telemetry.   Acute toxic encephalopathy/Hospital delirium -Had transient episodes of confusion yesterday, especially in the morning, appears to be in hospital delirium, as well he is intermittently somnolent, as well this is most likely due to him receiving IV Ativan at 2 AM, and Ambien around 5:30 AM.  No further benzos has been given during during hospital stay , patient back to baseline this morning.   SDH -Neurosurgery input greatly appreciated, not a surgical candidate, to be very minimal, as discussed with Dr. Danielle Dess okay to resume his Eliquis today.     Insomnia -Patient with severe recent insomnia, acute onset about a month ago -Will  discharge on Ambien as needed for patient to try.   AKI -Peaked at 1.7 on admission, has improved with IV fluids, renal function back to baseline   Chronic systolic CHF -2020 echo with EF 50-55% -Has AICD -Appears compensated, actually hypovolemic, so he received IV fluids, he is euvolemic at time of discharge.  Torsemide dose will be resumed as well at a lower dose, will keep on torsemide 10 mg every other day instead of daily.   Afib -Rate controlled with digoxin during hospital stay due to AKI, it is resumed at time of discharge, Toprol-XL has been held as well given hypotension while in ER, it will be resumed at half dose at time of discharge (100 mg will be decreased to 50 mg).. -Resume Eliquis on discharge   CAD -Continue Lipitor -No c/o chest pain at this time -Mildly elevated troponin with negative delta, doubt ACS   HTN -Please see the above discussion regarding Toprol-XL.   Hypothyroidism -Continue Synthroid -Normal TSH on 5/7   Severe protein calorie malnutrition -BMI is 16.64  -Nutritionist   consulted          Discharge Diagnoses:  Principal Problem:   Postural dizziness with presyncope Active Problems:   CAD in native artery   Protein calorie malnutrition (HCC)   Chronic systolic (congestive) heart failure (HCC)   Atrial fibrillation, chronic (HCC)   Presence of biventricular automatic implantable cardioverter defibrillator   Hypothyroidism   Insomnia   AKI (acute kidney injury) (HCC)   DNR (do not resuscitate)   Protein-calorie malnutrition, severe    Discharge Instructions  Discharge Instructions     Diet -  low sodium heart healthy   Complete by: As directed    Discharge instructions   Complete by: As directed    Follow with Primary MD [facility] in 7 days   Get CBC, CMP,  checked  by Primary MD next visit.    Activity: As tolerated with Full fall precautions use walker/cane & assistance as needed   Disposition Home     Diet: Heart Healthy ** , with feeding assistance and aspiration precautions.  For Heart failure patients - Check your Weight same time everyday, if you gain over 2 pounds, or you develop in leg swelling, experience more shortness of breath or chest pain, call your Primary MD immediately. Follow Cardiac Low Salt Diet and 1.5 lit/day fluid restriction.   On your next visit with your primary care physician please Get Medicines reviewed and adjusted.   Please request your Prim.MD to go over all Hospital Tests and Procedure/Radiological results at the follow up, please get all Hospital records sent to your Prim MD by signing hospital release before you go home.   If you experience worsening of your admission symptoms, develop shortness of breath, life threatening emergency, suicidal or homicidal thoughts you must seek medical attention immediately by calling 911 or calling your MD immediately  if symptoms less severe.  You Must read complete instructions/literature along with all the possible adverse reactions/side effects for all the Medicines you take and that have been prescribed to you. Take any new Medicines after you have completely understood and accpet all the possible adverse reactions/side effects.   Do not drive, operating heavy machinery, perform activities at heights, swimming or participation in water activities or provide baby sitting services if your were admitted for syncope or siezures until you have seen by Primary MD or a Neurologist and advised to do so again.  Do not drive when taking Pain medications.    Do not take more than prescribed Pain, Sleep and Anxiety Medications  Special Instructions: If you have smoked or chewed Tobacco  in the last 2 yrs please stop smoking, stop any regular Alcohol  and or any Recreational drug use.  Wear Seat belts while driving.   Please note  You were cared for by a hospitalist during your hospital stay. If you have any questions  about your discharge medications or the care you received while you were in the hospital after you are discharged, you can call the unit and asked to speak with the hospitalist on call if the hospitalist that took care of you is not available. Once you are discharged, your primary care physician will handle any further medical issues. Please note that NO REFILLS for any discharge medications will be authorized once you are discharged, as it is imperative that you return to your primary care physician (or establish a relationship with a primary care physician if you do not have one) for your aftercare needs so that they can reassess your need for medications and monitor your lab values.   Increase activity slowly   Complete by: As directed       Allergies as of 08/18/2022       Reactions   Grapefruit Extract    Lactose Other (See Comments)   GI intolerance    Spironolactone Other (See Comments)   Gynecomastia         Medication List     STOP taking these medications    furosemide 20 MG tablet Commonly known as: Lasix   ramelteon 8 MG  tablet Commonly known as: ROZEREM       TAKE these medications    acetaminophen 325 MG tablet Commonly known as: TYLENOL Take 2 tablets (650 mg total) by mouth every 6 (six) hours as needed for mild pain (or Fever >/= 101). What changed:  when to take this reasons to take this   apixaban 5 MG Tabs tablet Commonly known as: Eliquis Take 1 tablet (5 mg total) by mouth 2 (two) times daily.   atorvastatin 20 MG tablet Commonly known as: LIPITOR Take 1 tablet (20 mg total) by mouth at bedtime.   CENTRUM ADULT PO Take 1 tablet by mouth daily.   PRESERVISION AREDS 2 PO Take 1 capsule by mouth 2 (two) times daily.   cetirizine 10 MG tablet Commonly known as: ZYRTEC Take 10 mg by mouth daily.   digoxin 0.125 MG tablet Commonly known as: LANOXIN Take 0.125 mg by mouth daily.   feeding supplement Liqd Take 237 mLs by mouth 2 (two) times  daily between meals.   levothyroxine 100 MCG tablet Commonly known as: SYNTHROID Take 100 mcg by mouth daily before breakfast.   metoprolol succinate 100 MG 24 hr tablet Commonly known as: TOPROL-XL Take 0.5 tablets (50 mg total) by mouth daily. What changed:  how much to take Another medication with the same name was removed. Continue taking this medication, and follow the directions you see here.   ondansetron 4 MG disintegrating tablet Commonly known as: ZOFRAN-ODT Take 4 mg by mouth every 6 (six) hours as needed for nausea or vomiting.   potassium chloride SA 20 MEQ tablet Commonly known as: KLOR-CON M Take 20 mEq by mouth 2 (two) times daily.   torsemide 20 MG tablet Commonly known as: DEMADEX Take 0.5 tablets (10 mg total) by mouth every other day. What changed: when to take this   zolpidem 5 MG tablet Commonly known as: Ambien Take 1 tablet (5 mg total) by mouth at bedtime as needed for sleep.        Allergies  Allergen Reactions   Grapefruit Extract    Lactose Other (See Comments)    GI intolerance    Spironolactone Other (See Comments)    Gynecomastia       Consultations: none   Procedures/Studies: CT HEAD WO CONTRAST ( )  Result Date: 08/16/2022 CLINICAL DATA:  Altered mental status EXAM: CT HEAD WITHOUT CONTRAST TECHNIQUE: Contiguous axial images were obtained from the base of the skull through the vertex without intravenous contrast. RADIATION DOSE REDUCTION: This exam was performed according to the departmental dose-optimization program which includes automated exposure control, adjustment of the mA and/or kV according to patient size and/or use of iterative reconstruction technique. COMPARISON:  CT brain 08/09/22 FINDINGS: Brain: Compared to prior exam there may be a new small extra-axial fluid collection along the left frontal convexity, likely a subdural hematoma (series 4, image 22) measuring 5 mm. While the prominence of subarachnoid space was  present on prior imaging, the slightly hyperdense appearance is new from prior exam. No hydrocephalus. No CT evidence of an acute cortical infarct. Sequela of mild chronic microvascular ischemic change with generalized volume loss. No mass effect. Vascular: No hyperdense vessel or unexpected calcification. Skull: Normal. Negative for fracture or focal lesion. Sinuses/Orbits: No middle ear or mastoid effusion. Paranasal sinuses are clear. Orbits are unremarkable. Other: None. IMPRESSION: Compared to prior exam there may be a new small extra-axial fluid collection along the left frontal convexity, likely a subdural hematoma. While the prominence of  subarachnoid space was present on prior imaging, the slightly hyperdense appearance is new from prior exam. Recommend short interval follow-up head CT. Electronically Signed   By: Lorenza Cambridge M.D.   On: 08/16/2022 09:45   DG Chest 2 View  Result Date: 08/16/2022 CLINICAL DATA:  near syncope EXAM: CHEST - 2 VIEW COMPARISON:  Chest x-ray 08/13/2012 FINDINGS: Left chest wall dual lead pacemaker and defibrillator in similar position. The heart and mediastinal contours are unchanged. Aortic calcification. Hyperinflation of the lungs. Biapical pleural/pulmonary scarring. No focal consolidation. No pulmonary edema. No pleural effusion. No pneumothorax. No acute osseous abnormality. IMPRESSION: 1. No active cardiopulmonary disease. 2. Aortic Atherosclerosis (ICD10-I70.0) and Emphysema (ICD10-J43.9). Electronically Signed   By: Tish Frederickson M.D.   On: 08/16/2022 03:31   DG Chest 2 View  Result Date: 08/14/2022 CLINICAL DATA:  Shortness of breath EXAM: CHEST - 2 VIEW COMPARISON:  Aug 09, 2022 FINDINGS: Stable mild cardiomegaly and AICD device. No pneumothorax. No nodules or masses. No focal infiltrates. Flattening of the diaphragm on the lateral view. IMPRESSION: Mild cardiomegaly and flattening of the diaphragm on the lateral view consistent with COPD. No other acute  abnormalities. Electronically Signed   By: Gerome Sam III M.D.   On: 08/14/2022 12:02   DG Chest 2 View  Result Date: 08/09/2022 CLINICAL DATA:  Shortness of breath for weeks. EXAM: CHEST - 2 VIEW COMPARISON:  Radiograph earlier today at [facility] FINDINGS: Dual lead left-sided pacemaker in place. Stable heart size, upper normal. Unchanged mediastinal contours. Chronic hyperinflation and interstitial coarsening. There is no acute airspace disease, pleural effusion, or pneumothorax. Chronic lower thoracic compression deformity. IMPRESSION: 1. No acute abnormality. 2. Chronic hyperinflation and interstitial coarsening consistent with COPD. Electronically Signed   By: Narda Rutherford M.D.   On: 08/09/2022 16:24     Subjective:  Reports he was able to sleep for few hours overnight, otherwise denies any complaints, asking if he can go home today. Discharge Exam: Vitals:   08/18/22 0800 08/18/22 0955  BP: 90/65 126/66  Pulse: 82 90  Resp: (!) 29 20  Temp: 97.9 F (36.6 C) 97.9 F (36.6 C)  SpO2: 93% 93%   Vitals:   08/18/22 0014 08/18/22 0327 08/18/22 0800 08/18/22 0955  BP: 104/70 119/68 90/65 126/66  Pulse:  93 82 90  Resp:  14 (!) 29 20  Temp:  98.2 F (36.8 C) 97.9 F (36.6 C) 97.9 F (36.6 C)  TempSrc:  Oral Oral Oral  SpO2:  96% 93% 93%  Weight:      Height:        General: Pt is alert, awake, not in acute distress Cardiovascular: RRR, S1/S2 +, no rubs, no gallops Respiratory: CTA bilaterally, no wheezing, no rhonchi Abdominal: Soft, NT, ND, bowel sounds + Extremities: no edema, no cyanosis    The results of significant diagnostics from this hospitalization (including imaging, microbiology, ancillary and laboratory) are listed below for reference.     Microbiology: No results found for this or any previous visit (from the past 240 hour(s)).   Labs: BNP (last 3 results) Recent Labs    08/14/22 1206 08/16/22 0310  BNP 722.9* 911.8*   Basic Metabolic  Panel: Recent Labs  Lab 08/14/22 1205 08/16/22 0310 08/17/22 0740 08/18/22 0711  NA 139 138 137 136  K 3.5 3.4* 3.4* 3.3*  CL 93* 93* 101 99  CO2 34* 32 31 30  GLUCOSE 112* 104* 89 101*  BUN 14 23 17 14   CREATININE  1.29* 1.77* 1.28* 1.21  CALCIUM 8.9 8.9 8.7* 8.6*  MG  --   --   --  1.9  PHOS  --   --   --  2.8   Liver Function Tests: Recent Labs  Lab 08/16/22 0501  AST 35  ALT 31  ALKPHOS 98  BILITOT 0.7  PROT 6.8  ALBUMIN 3.4*   Recent Labs  Lab 08/16/22 0501  LIPASE 52*   No results for input(s): "AMMONIA" in the last 168 hours. CBC: Recent Labs  Lab 08/14/22 1205 08/16/22 0310 08/17/22 0740 08/18/22 0711  WBC 7.7 7.2 6.7 6.2  NEUTROABS 5.2 4.0  --  3.9  HGB 9.5* 9.5* 8.6* 8.6*  HCT 31.2* 31.2* 27.6* 27.4*  MCV 93.4 93.1 91.1 92.9  PLT 325 311 248 237   Cardiac Enzymes: No results for input(s): "CKTOTAL", "CKMB", "CKMBINDEX", "TROPONINI" in the last 168 hours. BNP: Invalid input(s): "POCBNP" CBG: No results for input(s): "GLUCAP" in the last 168 hours. D-Dimer No results for input(s): "DDIMER" in the last 72 hours. Hgb A1c No results for input(s): "HGBA1C" in the last 72 hours. Lipid Profile No results for input(s): "CHOL", "HDL", "LDLCALC", "TRIG", "CHOLHDL", "LDLDIRECT" in the last 72 hours. Thyroid function studies No results for input(s): "TSH", "T4TOTAL", "T3FREE", "THYROIDAB" in the last 72 hours.  Invalid input(s): "FREET3" Anemia work up No results for input(s): "VITAMINB12", "FOLATE", "FERRITIN", "TIBC", "IRON", "RETICCTPCT" in the last 72 hours. Urinalysis    Component Value Date/Time   COLORURINE YELLOW 06/13/2018 1309   APPEARANCEUR HAZY (A) 06/13/2018 1309   LABSPEC 1.026 06/13/2018 1309   PHURINE 5.0 06/13/2018 1309   GLUCOSEU NEGATIVE 06/13/2018 1309   HGBUR SMALL (A) 06/13/2018 1309   BILIRUBINUR NEGATIVE 06/13/2018 1309   KETONESUR 20 (A) 06/13/2018 1309   PROTEINUR 30 (A) 06/13/2018 1309   NITRITE NEGATIVE 06/13/2018  1309   LEUKOCYTESUR NEGATIVE 06/13/2018 1309   Sepsis Labs Recent Labs  Lab 08/14/22 1205 08/16/22 0310 08/17/22 0740 08/18/22 0711  WBC 7.7 7.2 6.7 6.2   Microbiology No results found for this or any previous visit (from the past 240 hour(s)).   Time coordinating discharge: Over 30 minutes  SIGNED:   Huey Bienenstock, MD  [facility] 08/18/2022, 11:53 AM Pager   If 7PM-7AM, please contact night-coverage [email or web address]

## 2022-08-18 NOTE — Progress Notes (Signed)
Nutrition Brief Note   RN informed RD that pt does not like Ensure and has been refusing. RD to discontinue. RN shared that pt ate all bacon ordered. RD to order double protein on trays.   Kirby Crigler RD, LDN Clinical Dietitian See Loretha Stapler for contact information.

## 2022-08-18 NOTE — Progress Notes (Signed)
Patient ID: Anthony Charles, male   DOB: 03/19/1946, 77 y.o.   MRN: [id number] Discussed with patients attending , ok to continue eliquis.

## 2023-04-07 ENCOUNTER — Other Ambulatory Visit: Payer: Self-pay

## 2023-09-03 DEATH — deceased
